# Patient Record
Sex: Male | Born: 1970
Health system: Southern US, Community
[De-identification: ages and names within clinical notes are randomized; demographics above are authoritative.]

## PROBLEM LIST (undated history)

## (undated) DIAGNOSIS — M12569 Traumatic arthropathy, unspecified knee: Secondary | ICD-10-CM

## (undated) DIAGNOSIS — G894 Chronic pain syndrome: Secondary | ICD-10-CM

## (undated) DIAGNOSIS — S92309A Fracture of unspecified metatarsal bone(s), unspecified foot, initial encounter for closed fracture: Secondary | ICD-10-CM

## (undated) DIAGNOSIS — F32A Depression, unspecified: Secondary | ICD-10-CM

## (undated) DIAGNOSIS — F419 Anxiety disorder, unspecified: Secondary | ICD-10-CM

## (undated) DIAGNOSIS — F329 Major depressive disorder, single episode, unspecified: Secondary | ICD-10-CM

## (undated) DIAGNOSIS — S32409A Unspecified fracture of unspecified acetabulum, initial encounter for closed fracture: Secondary | ICD-10-CM

## (undated) DIAGNOSIS — S82109A Unspecified fracture of upper end of unspecified tibia, initial encounter for closed fracture: Secondary | ICD-10-CM

## (undated) HISTORY — DX: Major depressive disorder, single episode, unspecified: F32.9

## (undated) HISTORY — DX: Unspecified fracture of unspecified acetabulum, initial encounter for closed fracture: S32.409A

## (undated) HISTORY — DX: Unspecified fracture of upper end of unspecified tibia, initial encounter for closed fracture: S82.109A

## (undated) HISTORY — DX: Traumatic arthropathy, unspecified knee: M12.569

## (undated) HISTORY — DX: Anxiety disorder, unspecified: F41.9

## (undated) HISTORY — DX: Fracture of unspecified metatarsal bone(s), unspecified foot, initial encounter for closed fracture: S92.309A

## (undated) HISTORY — DX: Depression, unspecified: F32.A

## (undated) HISTORY — DX: Chronic pain syndrome: G89.4

---

## 2001-08-30 ENCOUNTER — Encounter: Admission: RE | Admit: 2001-08-30 | Discharge: 2001-08-30 | Payer: Self-pay | Admitting: Oncology

## 2001-08-30 ENCOUNTER — Encounter (HOSPITAL_COMMUNITY): Admission: RE | Admit: 2001-08-30 | Discharge: 2001-09-29 | Payer: Self-pay | Admitting: Oncology

## 2001-10-03 ENCOUNTER — Encounter: Admission: RE | Admit: 2001-10-03 | Discharge: 2001-10-03 | Payer: Self-pay | Admitting: Oncology

## 2001-10-03 ENCOUNTER — Encounter (HOSPITAL_COMMUNITY): Admission: RE | Admit: 2001-10-03 | Discharge: 2001-11-02 | Payer: Self-pay | Admitting: Oncology

## 2001-10-22 ENCOUNTER — Encounter: Payer: Self-pay | Admitting: Internal Medicine

## 2001-10-23 ENCOUNTER — Inpatient Hospital Stay (HOSPITAL_COMMUNITY): Admission: EM | Admit: 2001-10-23 | Discharge: 2001-10-24 | Payer: Self-pay | Admitting: Internal Medicine

## 2001-11-06 ENCOUNTER — Encounter (HOSPITAL_COMMUNITY): Admission: RE | Admit: 2001-11-06 | Discharge: 2001-12-06 | Payer: Self-pay | Admitting: Oncology

## 2001-11-06 ENCOUNTER — Encounter: Admission: RE | Admit: 2001-11-06 | Discharge: 2001-11-06 | Payer: Self-pay | Admitting: Oncology

## 2001-11-14 ENCOUNTER — Ambulatory Visit (HOSPITAL_COMMUNITY): Admission: RE | Admit: 2001-11-14 | Discharge: 2001-11-14 | Payer: Self-pay | Admitting: Internal Medicine

## 2001-11-14 ENCOUNTER — Encounter: Payer: Self-pay | Admitting: Internal Medicine

## 2002-04-04 ENCOUNTER — Encounter (HOSPITAL_COMMUNITY): Admission: RE | Admit: 2002-04-04 | Discharge: 2002-05-04 | Payer: Self-pay | Admitting: Oncology

## 2002-11-02 ENCOUNTER — Encounter (HOSPITAL_COMMUNITY): Admission: RE | Admit: 2002-11-02 | Discharge: 2002-12-02 | Payer: Self-pay | Admitting: Oncology

## 2002-11-02 ENCOUNTER — Encounter: Admission: RE | Admit: 2002-11-02 | Discharge: 2002-11-02 | Payer: Self-pay | Admitting: Oncology

## 2003-02-07 ENCOUNTER — Encounter (HOSPITAL_COMMUNITY): Admission: RE | Admit: 2003-02-07 | Discharge: 2003-03-09 | Payer: Self-pay | Admitting: Unknown Physician Specialty

## 2003-02-07 ENCOUNTER — Encounter: Payer: Self-pay | Admitting: Unknown Physician Specialty

## 2003-11-08 ENCOUNTER — Emergency Department (HOSPITAL_COMMUNITY): Admission: EM | Admit: 2003-11-08 | Discharge: 2003-11-08 | Payer: Self-pay | Admitting: Emergency Medicine

## 2005-01-07 ENCOUNTER — Emergency Department (HOSPITAL_COMMUNITY): Admission: EM | Admit: 2005-01-07 | Discharge: 2005-01-07 | Payer: Self-pay | Admitting: *Deleted

## 2005-01-09 ENCOUNTER — Ambulatory Visit: Payer: Self-pay | Admitting: Physical Medicine & Rehabilitation

## 2005-01-09 ENCOUNTER — Inpatient Hospital Stay (HOSPITAL_COMMUNITY): Admission: AC | Admit: 2005-01-09 | Discharge: 2005-01-27 | Payer: Self-pay

## 2005-01-27 ENCOUNTER — Inpatient Hospital Stay (HOSPITAL_COMMUNITY)
Admission: RE | Admit: 2005-01-27 | Discharge: 2005-03-02 | Payer: Self-pay | Admitting: Physical Medicine & Rehabilitation

## 2005-01-27 ENCOUNTER — Ambulatory Visit: Payer: Self-pay | Admitting: Physical Medicine & Rehabilitation

## 2005-04-29 ENCOUNTER — Encounter (HOSPITAL_COMMUNITY)
Admission: RE | Admit: 2005-04-29 | Discharge: 2005-05-29 | Payer: Self-pay | Admitting: Physical Medicine & Rehabilitation

## 2005-06-07 ENCOUNTER — Encounter (HOSPITAL_COMMUNITY): Admission: RE | Admit: 2005-06-07 | Discharge: 2005-07-07 | Payer: Self-pay | Admitting: Internal Medicine

## 2005-07-09 ENCOUNTER — Encounter: Admission: RE | Admit: 2005-07-09 | Discharge: 2005-08-08 | Payer: Self-pay | Admitting: Internal Medicine

## 2005-08-11 ENCOUNTER — Encounter (HOSPITAL_COMMUNITY): Admission: RE | Admit: 2005-08-11 | Discharge: 2005-09-10 | Payer: Self-pay | Admitting: Internal Medicine

## 2006-01-26 ENCOUNTER — Ambulatory Visit: Payer: Self-pay | Admitting: Psychology
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
## 2006-10-04 ENCOUNTER — Encounter
Admission: RE | Admit: 2006-10-04 | Discharge: 2007-01-02 | Payer: Self-pay | Admitting: Physical Medicine & Rehabilitation

## 2006-10-07 ENCOUNTER — Ambulatory Visit: Payer: Self-pay | Admitting: Physical Medicine & Rehabilitation

## 2006-10-27 ENCOUNTER — Ambulatory Visit (HOSPITAL_COMMUNITY)
Admission: RE | Admit: 2006-10-27 | Discharge: 2006-10-27 | Payer: Self-pay | Admitting: Physical Medicine & Rehabilitation

## 2006-10-31 ENCOUNTER — Encounter
Admission: RE | Admit: 2006-10-31 | Discharge: 2007-01-29 | Payer: Self-pay | Admitting: Physical Medicine & Rehabilitation

## 2006-11-25 ENCOUNTER — Ambulatory Visit: Payer: Self-pay | Admitting: Physical Medicine & Rehabilitation

## 2007-01-23 ENCOUNTER — Encounter
Admission: RE | Admit: 2007-01-23 | Discharge: 2007-04-23 | Payer: Self-pay | Admitting: Physical Medicine & Rehabilitation

## 2007-01-23 ENCOUNTER — Ambulatory Visit: Payer: Self-pay | Admitting: Physical Medicine & Rehabilitation

## 2007-03-20 ENCOUNTER — Ambulatory Visit: Payer: Self-pay | Admitting: Physical Medicine & Rehabilitation

## 2007-05-10 ENCOUNTER — Encounter
Admission: RE | Admit: 2007-05-10 | Discharge: 2007-08-08 | Payer: Self-pay | Admitting: Physical Medicine & Rehabilitation

## 2007-05-10 ENCOUNTER — Ambulatory Visit: Payer: Self-pay | Admitting: Physical Medicine & Rehabilitation

## 2007-06-30 ENCOUNTER — Ambulatory Visit: Payer: Self-pay | Admitting: Physical Medicine & Rehabilitation

## 2007-09-21 ENCOUNTER — Encounter
Admission: RE | Admit: 2007-09-21 | Discharge: 2007-11-16 | Payer: Self-pay | Admitting: Physical Medicine & Rehabilitation

## 2007-09-21 ENCOUNTER — Ambulatory Visit: Payer: Self-pay | Admitting: Physical Medicine & Rehabilitation

## 2007-11-15 ENCOUNTER — Ambulatory Visit: Payer: Self-pay | Admitting: Physical Medicine & Rehabilitation

## 2007-12-12 ENCOUNTER — Encounter
Admission: RE | Admit: 2007-12-12 | Discharge: 2008-03-11 | Payer: Self-pay | Admitting: Physical Medicine & Rehabilitation

## 2008-01-03 ENCOUNTER — Ambulatory Visit: Payer: Self-pay | Admitting: Physical Medicine & Rehabilitation

## 2008-03-04 ENCOUNTER — Encounter
Admission: RE | Admit: 2008-03-04 | Discharge: 2008-06-02 | Payer: Self-pay | Admitting: Physical Medicine & Rehabilitation

## 2008-03-04 ENCOUNTER — Ambulatory Visit: Payer: Self-pay | Admitting: Physical Medicine & Rehabilitation

## 2008-03-20 ENCOUNTER — Ambulatory Visit: Payer: Self-pay | Admitting: Physical Medicine & Rehabilitation

## 2008-04-19 ENCOUNTER — Ambulatory Visit: Payer: Self-pay | Admitting: Physical Medicine & Rehabilitation

## 2008-05-16 ENCOUNTER — Ambulatory Visit: Payer: Self-pay | Admitting: Physical Medicine & Rehabilitation

## 2008-06-06 ENCOUNTER — Encounter
Admission: RE | Admit: 2008-06-06 | Discharge: 2008-09-04 | Payer: Self-pay | Admitting: Physical Medicine & Rehabilitation

## 2008-06-10 ENCOUNTER — Ambulatory Visit: Payer: Self-pay | Admitting: Physical Medicine & Rehabilitation

## 2008-07-12 ENCOUNTER — Ambulatory Visit: Payer: Self-pay | Admitting: Physical Medicine & Rehabilitation

## 2008-08-09 ENCOUNTER — Ambulatory Visit: Payer: Self-pay | Admitting: Physical Medicine & Rehabilitation

## 2008-09-04 ENCOUNTER — Encounter
Admission: RE | Admit: 2008-09-04 | Discharge: 2008-12-03 | Payer: Self-pay | Admitting: Physical Medicine & Rehabilitation

## 2008-09-06 ENCOUNTER — Ambulatory Visit: Payer: Self-pay | Admitting: Physical Medicine & Rehabilitation

## 2008-10-02 ENCOUNTER — Ambulatory Visit: Payer: Self-pay | Admitting: Physical Medicine & Rehabilitation

## 2008-10-29 ENCOUNTER — Ambulatory Visit: Payer: Self-pay | Admitting: Physical Medicine & Rehabilitation

## 2008-11-27 ENCOUNTER — Ambulatory Visit: Payer: Self-pay | Admitting: Physical Medicine & Rehabilitation

## 2008-12-30 ENCOUNTER — Encounter
Admission: RE | Admit: 2008-12-30 | Discharge: 2009-03-30 | Payer: Self-pay | Admitting: Physical Medicine & Rehabilitation

## 2008-12-31 ENCOUNTER — Ambulatory Visit: Payer: Self-pay | Admitting: Physical Medicine & Rehabilitation

## 2009-01-22 ENCOUNTER — Ambulatory Visit: Payer: Self-pay | Admitting: Physical Medicine & Rehabilitation

## 2009-02-21 ENCOUNTER — Ambulatory Visit: Payer: Self-pay | Admitting: Physical Medicine & Rehabilitation

## 2009-03-13 ENCOUNTER — Encounter
Admission: RE | Admit: 2009-03-13 | Discharge: 2009-06-11 | Payer: Self-pay | Admitting: Physical Medicine & Rehabilitation

## 2009-03-19 ENCOUNTER — Ambulatory Visit: Payer: Self-pay | Admitting: Physical Medicine & Rehabilitation

## 2009-04-16 ENCOUNTER — Ambulatory Visit: Payer: Self-pay | Admitting: Physical Medicine & Rehabilitation

## 2009-05-16 ENCOUNTER — Ambulatory Visit: Payer: Self-pay | Admitting: Physical Medicine & Rehabilitation

## 2009-06-11 ENCOUNTER — Ambulatory Visit: Payer: Self-pay | Admitting: Physical Medicine & Rehabilitation

## 2009-07-08 ENCOUNTER — Encounter
Admission: RE | Admit: 2009-07-08 | Discharge: 2009-09-22 | Payer: Self-pay | Admitting: Physical Medicine & Rehabilitation

## 2009-07-10 ENCOUNTER — Ambulatory Visit: Payer: Self-pay | Admitting: Physical Medicine & Rehabilitation

## 2009-08-08 ENCOUNTER — Ambulatory Visit: Payer: Self-pay | Admitting: Physical Medicine & Rehabilitation

## 2009-08-29 ENCOUNTER — Ambulatory Visit: Payer: Self-pay | Admitting: Physical Medicine & Rehabilitation

## 2009-09-22 ENCOUNTER — Encounter
Admission: RE | Admit: 2009-09-22 | Discharge: 2009-12-04 | Payer: Self-pay | Admitting: Physical Medicine & Rehabilitation

## 2009-09-26 ENCOUNTER — Ambulatory Visit: Payer: Self-pay | Admitting: Physical Medicine & Rehabilitation

## 2009-10-29 ENCOUNTER — Ambulatory Visit: Payer: Self-pay | Admitting: Physical Medicine & Rehabilitation

## 2009-12-01 ENCOUNTER — Ambulatory Visit: Payer: Self-pay | Admitting: Physical Medicine & Rehabilitation

## 2009-12-16 ENCOUNTER — Encounter (HOSPITAL_COMMUNITY)
Admission: RE | Admit: 2009-12-16 | Discharge: 2010-01-15 | Payer: Self-pay | Admitting: Physical Medicine & Rehabilitation

## 2009-12-25 ENCOUNTER — Encounter
Admission: RE | Admit: 2009-12-25 | Discharge: 2010-03-25 | Payer: Self-pay | Admitting: Physical Medicine & Rehabilitation

## 2009-12-31 ENCOUNTER — Ambulatory Visit: Payer: Self-pay | Admitting: Physical Medicine & Rehabilitation

## 2010-01-22 ENCOUNTER — Encounter (HOSPITAL_COMMUNITY)
Admission: RE | Admit: 2010-01-22 | Discharge: 2010-02-19 | Payer: Self-pay | Admitting: Physical Medicine & Rehabilitation

## 2010-01-29 ENCOUNTER — Ambulatory Visit: Payer: Self-pay | Admitting: Physical Medicine & Rehabilitation

## 2010-02-23 ENCOUNTER — Ambulatory Visit: Payer: Self-pay | Admitting: Physical Medicine & Rehabilitation

## 2010-03-19 ENCOUNTER — Ambulatory Visit: Payer: Self-pay | Admitting: Physical Medicine & Rehabilitation

## 2010-04-21 ENCOUNTER — Encounter
Admission: RE | Admit: 2010-04-21 | Discharge: 2010-04-23 | Payer: Self-pay | Admitting: Physical Medicine & Rehabilitation

## 2010-04-23 ENCOUNTER — Ambulatory Visit: Payer: Self-pay | Admitting: Physical Medicine & Rehabilitation

## 2010-06-17 ENCOUNTER — Ambulatory Visit: Payer: Self-pay | Admitting: Physical Medicine & Rehabilitation

## 2010-06-17 ENCOUNTER — Encounter
Admission: RE | Admit: 2010-06-17 | Discharge: 2010-09-07 | Payer: Self-pay | Admitting: Physical Medicine & Rehabilitation

## 2010-07-16 ENCOUNTER — Ambulatory Visit: Payer: Self-pay | Admitting: Physical Medicine & Rehabilitation

## 2010-08-13 ENCOUNTER — Ambulatory Visit: Payer: Self-pay | Admitting: Physical Medicine & Rehabilitation

## 2010-09-07 ENCOUNTER — Encounter
Admission: RE | Admit: 2010-09-07 | Discharge: 2010-11-25 | Payer: Self-pay | Source: Home / Self Care | Attending: Physical Medicine & Rehabilitation | Admitting: Physical Medicine & Rehabilitation

## 2010-09-14 ENCOUNTER — Ambulatory Visit: Payer: Self-pay | Admitting: Physical Medicine & Rehabilitation

## 2010-10-08 ENCOUNTER — Ambulatory Visit: Payer: Self-pay | Admitting: Physical Medicine & Rehabilitation

## 2010-11-03 ENCOUNTER — Ambulatory Visit: Payer: Self-pay | Admitting: Physical Medicine & Rehabilitation

## 2010-11-25 ENCOUNTER — Ambulatory Visit: Payer: Self-pay | Admitting: Physical Medicine & Rehabilitation

## 2010-12-30 ENCOUNTER — Encounter
Admission: RE | Admit: 2010-12-30 | Discharge: 2010-12-31 | Payer: Self-pay | Source: Home / Self Care | Attending: Physical Medicine & Rehabilitation | Admitting: Physical Medicine & Rehabilitation

## 2010-12-31 ENCOUNTER — Ambulatory Visit
Admission: RE | Admit: 2010-12-31 | Discharge: 2010-12-31 | Payer: Self-pay | Source: Home / Self Care | Attending: Physical Medicine & Rehabilitation | Admitting: Physical Medicine & Rehabilitation

## 2011-01-03 ENCOUNTER — Encounter: Payer: Self-pay | Admitting: Urology

## 2011-01-28 ENCOUNTER — Ambulatory Visit (HOSPITAL_BASED_OUTPATIENT_CLINIC_OR_DEPARTMENT_OTHER): Payer: Medicaid Other

## 2011-01-28 ENCOUNTER — Encounter: Payer: Medicaid Other | Attending: Physical Medicine & Rehabilitation

## 2011-01-28 DIAGNOSIS — IMO0002 Reserved for concepts with insufficient information to code with codable children: Secondary | ICD-10-CM

## 2011-01-28 DIAGNOSIS — S32409A Unspecified fracture of unspecified acetabulum, initial encounter for closed fracture: Secondary | ICD-10-CM

## 2011-01-28 DIAGNOSIS — S92309A Fracture of unspecified metatarsal bone(s), unspecified foot, initial encounter for closed fracture: Secondary | ICD-10-CM

## 2011-01-28 DIAGNOSIS — R262 Difficulty in walking, not elsewhere classified: Secondary | ICD-10-CM | POA: Insufficient documentation

## 2011-01-28 DIAGNOSIS — M12569 Traumatic arthropathy, unspecified knee: Secondary | ICD-10-CM | POA: Insufficient documentation

## 2011-01-28 DIAGNOSIS — F341 Dysthymic disorder: Secondary | ICD-10-CM | POA: Insufficient documentation

## 2011-01-28 DIAGNOSIS — G8929 Other chronic pain: Secondary | ICD-10-CM | POA: Insufficient documentation

## 2011-03-24 ENCOUNTER — Encounter: Payer: Medicaid Other | Attending: Physical Medicine & Rehabilitation | Admitting: Physical Medicine & Rehabilitation

## 2011-03-24 ENCOUNTER — Encounter: Payer: Medicaid Other | Admitting: Physical Medicine & Rehabilitation

## 2011-03-24 DIAGNOSIS — S92309A Fracture of unspecified metatarsal bone(s), unspecified foot, initial encounter for closed fracture: Secondary | ICD-10-CM

## 2011-03-24 DIAGNOSIS — R110 Nausea: Secondary | ICD-10-CM | POA: Insufficient documentation

## 2011-03-24 DIAGNOSIS — G894 Chronic pain syndrome: Secondary | ICD-10-CM | POA: Insufficient documentation

## 2011-03-24 DIAGNOSIS — R209 Unspecified disturbances of skin sensation: Secondary | ICD-10-CM | POA: Insufficient documentation

## 2011-03-24 DIAGNOSIS — S82109A Unspecified fracture of upper end of unspecified tibia, initial encounter for closed fracture: Secondary | ICD-10-CM

## 2011-03-24 DIAGNOSIS — S32409A Unspecified fracture of unspecified acetabulum, initial encounter for closed fracture: Secondary | ICD-10-CM

## 2011-03-24 DIAGNOSIS — F341 Dysthymic disorder: Secondary | ICD-10-CM | POA: Insufficient documentation

## 2011-03-24 DIAGNOSIS — M1259 Traumatic arthropathy, multiple sites: Secondary | ICD-10-CM | POA: Insufficient documentation
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
## 2011-04-22 ENCOUNTER — Encounter: Payer: Medicaid Other | Attending: Physical Medicine & Rehabilitation

## 2011-04-22 ENCOUNTER — Ambulatory Visit: Payer: Medicaid Other

## 2011-04-22 DIAGNOSIS — M12579 Traumatic arthropathy, unspecified ankle and foot: Secondary | ICD-10-CM | POA: Insufficient documentation

## 2011-04-22 DIAGNOSIS — G890 Central pain syndrome: Secondary | ICD-10-CM | POA: Insufficient documentation

## 2011-04-22 DIAGNOSIS — S82109A Unspecified fracture of upper end of unspecified tibia, initial encounter for closed fracture: Secondary | ICD-10-CM

## 2011-04-22 DIAGNOSIS — S32409A Unspecified fracture of unspecified acetabulum, initial encounter for closed fracture: Secondary | ICD-10-CM

## 2011-04-22 DIAGNOSIS — F411 Generalized anxiety disorder: Secondary | ICD-10-CM | POA: Insufficient documentation

## 2011-04-22 DIAGNOSIS — M25569 Pain in unspecified knee: Secondary | ICD-10-CM | POA: Insufficient documentation

## 2011-04-22 DIAGNOSIS — M12569 Traumatic arthropathy, unspecified knee: Secondary | ICD-10-CM | POA: Insufficient documentation

## 2011-04-22 DIAGNOSIS — S92309A Fracture of unspecified metatarsal bone(s), unspecified foot, initial encounter for closed fracture: Secondary | ICD-10-CM
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
## 2011-04-27 NOTE — Assessment & Plan Note (Signed)
FOLLOWUP OFFICE NOTE   Mike Coleman is back regarding his pain.  He did not do well transitioning from  Duragesic to Kadian.  I increased his Kadian this week to t.i.d. dosing,  and this did not make much of a difference.  He is having 9/10 pain.  The pain is stabbing and constant.  The pain interferes with general  activity, relations with others, and enjoyment of life on a severe  level.  Sleep is poor.  Pain increases with walking, bending, sitting,  standing, and some activities.  He states he can walk about 20 minutes  at a time without having to stop.   REVIEW OF SYSTEMS:  Notable for numbness, tingling, trouble walking.  Other pertinent positives as listed above and full review is in the  health and history section of the chart.  The patient's urine drug  screen was negative at last visit.   PHYSICAL EXAM:  Blood pressure 132/77, pulse 91, respiratory rate 16,  satting 98% on room air.  The patient is pleasant, a bit disjointed and inconsistent with his  thought process, but is much improved from last visit.  He appears more  alert and appropriate.  HEART:  Regular.  CHEST:  Clear.  ABDOMEN:  Soft and nontender.  Remains tender in his usual spots in the right foot and left knee.   ASSESSMENT:  History of polytrauma with left tibial plateau and right  metatarsal fractures.   PLAN:  1. Will change back to Duragesic 200 mcg q.72h #20 100 mcg patches.  2. Continue oxycodone for breakthrough pain 15 mg 1 q.4-6h p.r.n.      #150.  3. Will titrate up Lyrica to 75 mg t.i.d. at least.  4. Continue Celebrex.  5. Continue Tofranil 75 mg nightly  6. I will see the patient back in about 1 to 2 months' time.      Ranelle Oyster, M.D.  Electronically Signed     ZTS/MedQ  D:  06/02/2007 13:10:39  T:  06/02/2007 16:36:09  Job #:  [id number]   cc:   Kingsley Callander. Ouida Sills, MD  Fax: [phone]

## 2011-04-27 NOTE — Assessment & Plan Note (Signed)
Mike Coleman is back regarding his pain.  He has been doing a bit better with  the Duragesic patches again.  He is having more left knee pain, however.  The left knee pain seems to be surpassing some of his foot pain now.  He  has not been able to get in touch with the surgeon who had performed the  surgery initially on the left knee.  He states that he is afraid to walk  out of the house, particularly in the grass and uneven surfaces due to  the fact that his knee feels like it is giving way.   The patient rates his pain as an 8 out of 10.  He states that it  interferes with general activity, relations with others, and enjoyment  of life on a severe level.   The patient remains on:  1. Duragesic 200 mcg every 72 hours.  2. Oxycodone 15 mg one q.4-6 h. p.r.n.  3. Lyrica has titrated up to 75 mg t.i.d. after last visit.   REVIEW OF SYSTEMS:  Notable for the above.  A full review is in the  written health and history section.   SOCIAL HISTORY:  Without significant change.   PHYSICAL EXAMINATION:  VITAL SIGNS:  Blood pressure 135/61, pulse is  105, respiratory rate 20, sating 96% on room air.  GENERAL:  The patient is generally pleasant.  A bit more alert today and  less anxious.  HEART:  Regular rhythm with increased rate.  CHEST:  Clear.  ABDOMEN:  Soft, nontender.  EXTREMITIES:  The left knee is notable for palpable hardware at the left  tibia and positive anterior drawer sign today with pain in the medial  compartment as well with varus stress.  Questionable posterior drawer  sign.  Right foot is stable with pain and some deformity noted.   ASSESSMENT:  History of poly trauma including left tibial plateau and  right metatarsal fractures.   PLAN:  1. Continue Duragesic patch 200 mcg every 72 hours.  2. Oxycodone 15 mg one q.4-6 h. p.r.n.  3. Continue Lyrica, Celebrex, Tofranil.  4. The patient needs an appointment with orthopedic surgery to      evaluate the left knee.  It is  probably a bit early for a knee      replacement for him but perhaps he could have hardware removed or      further reconstructive surgery performed.  I will leave this up to      surgery.  He should ask Dr. Lestine Box when he sees him back in      followup if      he may see one of his partners for evaluation of the left knee.  5. I will see him back in 3 months with a nurse clinic followup in 1      month.      Ranelle Oyster, M.D.  Electronically Signed     ZTS/MedQ  D:  07/03/2007 12:54:00  T:  07/03/2007 14:15:52  Job #:  [id number]   cc:   Kingsley Callander. Ouida Sills, MD  Fax: [phone]

## 2011-04-27 NOTE — Assessment & Plan Note (Signed)
Mike Coleman is back regarding his pain.  He asked about taking his Duragesic  more often at q.48h intervals.  His pain has been a little bit worse in  the left leg after falling on his left knee in the yard.  It is easing  up a bit, but still more of a problem.  He still complains of right hip  pain as well.  Pain rates a 9/10 today.  Pain interferes with his  general activity, relations with others, and enjoyment of life on a  severe level.  Sleep is fair.   REVIEW OF SYSTEMS:  Notable for numbness, still has a catching in his  left knee and hip.   Left knee is notable for an abrasion where he fell, but seems to be  intact and generally stable.  Strength is generally intact with some  pain inhibition still at the right ankle and foot.  Abdomen is soft and nontender.  Chest is clear.  Heart is regular rate and rhythm.   ASSESSMENT:  History of polytrauma with left tibial plateau and right  metatarsal fractures.   PLAN:  1. Will increase to q48 hour Fentanyl but decrease overall dosing to      150 mcg.  2. Oxycodone 15 mg 1 q.4h to q.6h p.r.n.  3. Continue Lyrica, Celebrex, Tofranil, and Celexa.  4. I urged him follow up with orthopedic surgery.  Dr. Fonnie Jarvis stated      that he would perform surgery if he quit smoking, but he has yet to      do that.  I think there are some other issues still involved here      with Mike Coleman related to his decadence upon the pain medication.  We      will watch him closely in the months going forward.      Ranelle Oyster, M.D.  Electronically Signed     ZTS/MedQ  D:  12/15/2007 13:18:18  T:  12/15/2007 14:11:39  Job #:  [id number]   cc:   Kingsley Callander. Ouida Sills, MD  Fax: [phone]
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
## 2011-04-30 NOTE — Assessment & Plan Note (Signed)
Mike Coleman is back regarding his chronic back and leg pain.  Really not much  as change since I last saw him.  His pain is 8-9/10.  Pain is sharp,  stabbing, and aching.  Pain interferes with general activity, relations  with others, enjoyment life on a severe level.  Sleep is poor.   REVIEW OF SYSTEMS:  Notable for numbness, tingling, weakness, trouble  walking, confusion, depression, anxiety, diarrhea, nausea, and poor  appetite.  Other pertinent positives are above and full review is in the  written health and history section of the chart.   SOCIAL HISTORY:  The patient is divorced and lives with his son,  although his parents care for him during the week.   PHYSICAL EXAMINATION:  VITAL SIGNS:  Blood pressure is 130/65, pulse is  105, respiratory rate is 16.  He is sating 93% on room air.  GENERAL:  The patient is generally pleasant.  He is disheveled in  appearance.  EXTREMITIES:  He walks with his cane, supporting his left leg more than  the right.  NEUROLOGIC:  His cognition is normal.  HEART:  Regular rhythm, tachycardiac.  CHEST:  Clear.  ABDOMEN:  Soft, nontender.   ASSESSMENT:  1. History of polytrauma with left tibial plateau, fracture of right      metatarsal fracture with post-traumatic arthritis.  2. Chronic pain/central sensitization.  3. Chronic low back pain.  4. Depression with anxiety.   PLAN:  1. Again, the patient remained centered on his pain and it is primary      focus of his life.  He has no social, vocational, or family      activities except for limited times on the weekend that he spends      with his son.  I expressed at length the need for the patient to      pursue other avenues to consume his life.  We have spoke in the      past about lumbar sympathetic ganglion blocks and they also spoke      about spinal stimulator, although I am not overwhelmingly      optimistic that these would be beneficial on a long run considering      his chronic picture.  2. I will see him back in 3 months with 23-month followup.  He was with      his fentanyl and oxycodone refilled today.      Ranelle Oyster, M.D.  Electronically Signed     ZTS/MedQ  D:  08/29/2009 10:05:52  T:  08/30/2009 00:27:39  Job #:  [id number]   cc:   Kingsley Callander. Ouida Sills, MD  Fax: [phone]

## 2011-05-19 ENCOUNTER — Ambulatory Visit: Payer: Medicaid Other | Admitting: Physical Medicine & Rehabilitation

## 2011-05-19 ENCOUNTER — Ambulatory Visit: Payer: Medicaid Other

## 2011-05-26 ENCOUNTER — Encounter: Payer: Medicaid Other | Attending: Physical Medicine & Rehabilitation | Admitting: Physical Medicine & Rehabilitation

## 2011-05-26 DIAGNOSIS — F341 Dysthymic disorder: Secondary | ICD-10-CM

## 2011-05-26 DIAGNOSIS — S32409A Unspecified fracture of unspecified acetabulum, initial encounter for closed fracture: Secondary | ICD-10-CM

## 2011-05-26 DIAGNOSIS — S82109A Unspecified fracture of upper end of unspecified tibia, initial encounter for closed fracture: Secondary | ICD-10-CM

## 2011-05-26 DIAGNOSIS — S92309A Fracture of unspecified metatarsal bone(s), unspecified foot, initial encounter for closed fracture: Secondary | ICD-10-CM

## 2011-05-26 DIAGNOSIS — G894 Chronic pain syndrome: Secondary | ICD-10-CM | POA: Insufficient documentation

## 2011-05-26 DIAGNOSIS — M1259 Traumatic arthropathy, multiple sites: Secondary | ICD-10-CM | POA: Insufficient documentation

## 2011-05-27 NOTE — Assessment & Plan Note (Signed)
Mike Coleman is back regarding his multiple pain complaints.  He continues to have issues with anxiety.  He points to his Valium being the source of all his problems including pain, mood, sleep, etc.  Pain is 6-8/10.  I showed him his pain levels over the last 2 years which are not to dissimilar from that.  Pain is burning, stabbing, aching, tingling involving the back as well as right hip, left foot and left knee.  REVIEW OF SYSTEMS:  Notable for numbness, anxiety, constipation, abdominal pain and limb swelling.  SOCIAL HISTORY:  The patient lives with his son and involved with his parents, although he has problems dealing with his parents who feels resent him.  He feels limited due to transportation as well.  Pain also is a factor.  PHYSICAL EXAMINATION:  VITAL SIGNS:  Blood pressure 120/64, pulse 76, respiratory rate 18, he is satting 96% on room air. GENERAL:  He is generally pleasant, alert.  Insight is I feel limited today.  He is generally appropriate otherwise. EXTREMITIES:  He does walk with antalgia favoring the left leg more than right, but both are tender.  He has chronic flat affect. HEART:  Regular. CHEST:  Clear. ABDOMEN:  Soft, nontender.  ASSESSMENT: 1. History of left tibial plateau fracture, right metatarsal fractures     with post-traumatic arthritis. 2. Chronic pain syndrome with central sensitization. 3. Anxiety with depression.  PLAN: 1. I agreed to write valium today as the patient will not let off     this.  However, I gave him the stipulations that he will need to     participate in outpatient counseling/Psychology program as well as     take his scheduled Zoloft 50 mg daily. 2. We stopped Klonopin which was also a stipulation for receiving the     valium. 3. Refilled oxycodone #90, fentanyl patch 100 mcg #30 as well as     Celebrex and Limbrel. 4. We discussed with the patient that there is not an answer in a     bottle here for him.  He has learned some  coping strategies and     skills and learned to help himself a bit which I feel has lost     along the way here over the last several years.  Again, I feel the     patient's insight overall is limited in this area. 5. I will see the nurse practitioner back in a month.  ADDENDUM:  It was brought to my attention after this visit that Mike Coleman was receiving xanax and klonopin from another provider (which he did not disclose).  My RN was asked to call him and give Mike Coleman the instructions to return to our office to surrender these meds.  At that time, we'll release the prescription for the valium at the pharmacy.  If he fails to surrender these meds to [place], no further prescriptions of any kind will be provided to [place], and he will be discharged from this office.  Any noncompliance with the other listed stipulations will mean discharge from this office.  A UDS will be collected at the time of the next visit here pending his compliance with the above stipulations.    Ranelle Oyster, M.D. Electronically Signed   ZTS/MedQ D:  05/26/2011 14:53:23  T:  05/27/2011 01:14:56  Job #:  [id number]

## 2011-06-15 ENCOUNTER — Encounter: Payer: Medicaid Other | Attending: Neurosurgery | Admitting: Neurosurgery

## 2011-06-15 DIAGNOSIS — M549 Dorsalgia, unspecified: Secondary | ICD-10-CM | POA: Insufficient documentation

## 2011-06-15 DIAGNOSIS — M12569 Traumatic arthropathy, unspecified knee: Secondary | ICD-10-CM | POA: Insufficient documentation

## 2011-06-15 DIAGNOSIS — F329 Major depressive disorder, single episode, unspecified: Secondary | ICD-10-CM

## 2011-06-15 DIAGNOSIS — G894 Chronic pain syndrome: Secondary | ICD-10-CM

## 2011-06-15 DIAGNOSIS — G8929 Other chronic pain: Secondary | ICD-10-CM | POA: Insufficient documentation

## 2011-06-15 DIAGNOSIS — F341 Dysthymic disorder: Secondary | ICD-10-CM | POA: Insufficient documentation

## 2011-07-10 NOTE — Assessment & Plan Note (Signed)
This is a patient of Dr. Rosalyn Charters who was seen for multiple pain complaints.  He has problems with anxiety as well as back pain, mood and sleep issues.  He saw Dr. Riley Kill last month and will follow up with him next Monday.  The patient reports no pain changes.  He rates his pain 5- 7, it is a stabbing, tingling-type pain.  His general activity is at the level of 7-9.  Pain is the same 24 hours a day.  Sleep patterns are poor.  Walking, bending, sitting tend to aggravate; rest, therapy, and medication tend to help.  He walks without assistance.  He can walk up to 30 minutes at a time.  He is not employed.  REVIEW OF SYSTEMS:  Notable for those difficulties as well as some paresthesias, depression, anxiety.  No suicidal thoughts or aberrant behaviors.  PAST MEDICAL HISTORY:  Unchanged.  SOCIAL HISTORY:  He is divorced, lives with his son.  FAMILY HISTORY:  Unchanged.  PHYSICAL EXAMINATION:  VITAL SIGNS:  His blood pressure is 129/73, pulse 86, respirations 20, O2 sats 95 on room air. CONSTITUTIONAL:  He is within normal limits. NEUROLOGIC:  He is alert and oriented.  His strength is good.  His sensations are intact.  ASSESSMENT: 1. History of left tibia-fibula fracture with arthritis resulting. 2. Chronic pain. 3. Anxiety and depression.  PLAN: 1. He will follow up with Dr. Riley Kill in 1 month. 2. Refill Duragesic transdermal 100 mcg 2 transdermal every 48 hours,     30 with no refill. 3. Oxycodone 50 mg 1-2 p.o. q.6 h. p.r.n. 90 with no refill.  He is in     agreement with the plan.  His questions were encouraged and     answered.     Naliyah Neth L. Blima Dessert Electronically Signed    RLW/MedQ D:  07/09/2011 15:06:27  T:  07/10/2011 00:54:41  Job #:  [id number]

## 2011-07-14 ENCOUNTER — Encounter: Payer: Medicaid Other | Attending: Physical Medicine & Rehabilitation | Admitting: Physical Medicine & Rehabilitation

## 2011-07-14 DIAGNOSIS — F341 Dysthymic disorder: Secondary | ICD-10-CM | POA: Insufficient documentation

## 2011-07-14 DIAGNOSIS — S82109A Unspecified fracture of upper end of unspecified tibia, initial encounter for closed fracture: Secondary | ICD-10-CM

## 2011-07-14 DIAGNOSIS — S32409A Unspecified fracture of unspecified acetabulum, initial encounter for closed fracture: Secondary | ICD-10-CM

## 2011-07-14 DIAGNOSIS — S92309A Fracture of unspecified metatarsal bone(s), unspecified foot, initial encounter for closed fracture: Secondary | ICD-10-CM

## 2011-07-14 DIAGNOSIS — R209 Unspecified disturbances of skin sensation: Secondary | ICD-10-CM | POA: Insufficient documentation

## 2011-07-14 DIAGNOSIS — M549 Dorsalgia, unspecified: Secondary | ICD-10-CM | POA: Insufficient documentation

## 2011-07-14 NOTE — Assessment & Plan Note (Signed)
Mike Coleman is back regarding his diffuse pain particularly in the back and lower extremities.  He is now set up to see psychologist in his area, deemed [name].  He is involved in a group program through [facility] which he does not feel is quite appropriate for him.  We had placed the patient on Klonopin.  There was confusion regarding his prior Valium, Xanax.  The patient was not being totally on the level with [place] regarding his medications he was taking all three.  I had warned him about taking meds that are not prescribed by our practice and the ramifications for him in this clinic.  He tells me that he ran short of his Valium and had a lot of anxiety about this appointment and took some remaining Klonopin which he is now out of.  He states that he needs more Valium to help control his mood.  He still remains quite reclusive, depressed, etc.  Pain is still 8-9/10 in the familiar areas including his back, knees, and feet.  Sleep is poor.  REVIEW OF SYSTEMS:  Notable for the above.  He does have some anxiety, diarrhea, constipation, depression.  Full 12-point review is in the written health and history section of the chart.  SOCIAL HISTORY:  The patient is divorced.  He lives with his son.  PHYSICAL EXAMINATION:  VITAL SIGNS:  Blood pressure is 130/83, pulse 80, respiratory rate 16, he is satting 94% on room air. GENERAL:  The patient is pleasant, cooperative.  He does get anxious at times.  He has sometimes problems finding words and thoughts, which is anxiety. MUSCULOSKELETAL:  Lower extremity exam is unchanged with tender range of motion in the knees and pain with palpation of the feet and weightbearing.  ASSESSMENT: 1. History of left tibia fibula fracture with posttraumatic arthritis     resulting chronic pain syndrome. 2. Anxiety and depression.  PLAN: 1. I am happy that he is beginning psychological followup.  I stated     that his participation in our pain program is  continued upon     psychological assistance as that is a key component here for him as     he lost all compensating abilities and he has become quite     depressed and anxious.  He needs to find himself a way with the     help of our clinic as well as a psychologist in getting out of the     "groove" that he is in.  We discussed this on multiple occasions.     However, this is not going to happen on its own and he will need to     be in active participating at any type of counseling program going     forward.  I will continue with the group therapy for now unless Dr.     [name] feels it is counterproductive for him. 2. I refilled his oxycodone 15, #90, his fentanyl patch 100 mcg #30     today. 3. I increased his Valium to 10 twice a day and added trazodone 1-2 at     nighttime for anxiety, depression, and sleep. 4. I made it clear in front of my nurse that any further drug test     which demonstrate medication is not prescribed through this office     will mean his abrupt discharge from this clinic.  We have given     Mike Coleman plenty of slack here     and I  could not continue to do so over the long term.  He is quite     clear with this and agrees. 5. We will see him back in a month.  We will check a urine drug screen     at that time.     Ranelle Oyster, M.D. Electronically Signed    ZTS/MedQ D:  07/14/2011 14:53:23  T:  07/14/2011 22:20:49  Job #:  [id number]  cc:   Audrie Gallus Fax [phone]

## 2011-08-12 ENCOUNTER — Encounter (HOSPITAL_BASED_OUTPATIENT_CLINIC_OR_DEPARTMENT_OTHER): Payer: Medicaid Other | Admitting: Neurosurgery

## 2011-08-12 DIAGNOSIS — F341 Dysthymic disorder: Secondary | ICD-10-CM

## 2011-08-12 DIAGNOSIS — G8928 Other chronic postprocedural pain: Secondary | ICD-10-CM

## 2011-08-13 NOTE — Assessment & Plan Note (Signed)
HISTORY:  Patient Mike Coleman, seen for diffuse back and lower extremity pain as well as neck pain.  The patient reports no change in his medicines, but he does say more anxiety levels since Mike Coleman dropped his Valium down to twice a day until three times a day and is requesting increase that.  I have explained to him I could not do so, but I will place our chart on Dr. Rosalyn Charters desk for him to review and have one of the nursing staff call back with his disposition.  The patient states he is still in group therapy.  He is slated to see an individual psychologist sometime in the near future.  He did not have the name but states he will keep that appointment.  He rates his average pain is 7-8, sharp, burning, stabbing, tingling pain.  The patient states he does not want to get back to the "safety was in prior to his three times a day Valium."  General activity level is 7-8.  Pain is same 24 hours a day. Sleep patterns are fair.  Pain is worse with walking, sitting, and standing.  Rest and medication tend to help.  He walks without assistance.  He walk about 25 minutes at a time.  He is unemployed.  He needs help with his household duties and shopping.  He states he would like to get back to school but he is afraid to groom his public contact.  REVIEW OF SYSTEMS:  Notable for difficulties as described above as well as some numbness and tremors, confusion, anxiety, no suicidal thoughts or aberrant behaviors.  SOCIAL HISTORY:  He has some nausea, constipation, limb swelling, shortness of breath from time to time, his anxiety states it is got worsening since he is again had his Valium reduced.  PAST MEDICAL HISTORY:  Unchanged.  SOCIAL HISTORY:  Lives alone.  FAMILY HISTORY:  Unchanged.  PHYSICAL EXAMINATION:  VITAL SIGNS:  His blood pressure is 133/70, pulse is 100, respirations 18, and O2 sats 93% on room air. NEUROLOGIC:  His motor strength is good in his upper and  lower extremities.  Constitutionally within normal limits.  He is alert and oriented x3.  His gait is normal.  He does seem somewhat shy today.  ASSESSMENT: 1. History of trauma with the left tib-fib fracture.  He has post-     traumatic arthritis and chronic pain. 2. Anxiety and depression.  PLAN: 1. He will continue with his group therapy and follow up with his     individual psychologists as this point approaches. 2. Refill oxycodone 50 mg 1-2  p.o. q.6 h. p.r.n. 90 with no refill. 3. Duragesic 100 mcg two transdermally every 48 hours 30 with no     refill. 4. He will follow up my clinic in 1 month.  I will place the chart of     Mike Coleman to make a disposition on his Valium.  His questions     otherwise were encouraged and answered.     Loveda Colaizzi L. Blima Dessert     Ranelle Oyster, M.D. Electronically Signed   RLW/MedQ D:  08/12/2011 13:47:56  T:  08/12/2011 16:01:32  Job #:  [id number]

## 2011-09-08 ENCOUNTER — Encounter: Payer: Medicaid Other | Attending: Neurosurgery | Admitting: Neurosurgery

## 2011-09-08 ENCOUNTER — Ambulatory Visit: Payer: Medicaid Other | Admitting: Neurosurgery

## 2011-09-08 DIAGNOSIS — F329 Major depressive disorder, single episode, unspecified: Secondary | ICD-10-CM | POA: Insufficient documentation

## 2011-09-08 DIAGNOSIS — F341 Dysthymic disorder: Secondary | ICD-10-CM

## 2011-09-08 DIAGNOSIS — G894 Chronic pain syndrome: Secondary | ICD-10-CM

## 2011-09-08 DIAGNOSIS — M542 Cervicalgia: Secondary | ICD-10-CM | POA: Insufficient documentation

## 2011-09-08 DIAGNOSIS — F411 Generalized anxiety disorder: Secondary | ICD-10-CM | POA: Insufficient documentation

## 2011-09-08 DIAGNOSIS — F3289 Other specified depressive episodes: Secondary | ICD-10-CM | POA: Insufficient documentation

## 2011-09-08 DIAGNOSIS — G8929 Other chronic pain: Secondary | ICD-10-CM | POA: Insufficient documentation

## 2011-09-08 DIAGNOSIS — M79609 Pain in unspecified limb: Secondary | ICD-10-CM | POA: Insufficient documentation

## 2011-09-08 DIAGNOSIS — M12569 Traumatic arthropathy, unspecified knee: Secondary | ICD-10-CM | POA: Insufficient documentation

## 2011-09-08 DIAGNOSIS — Z8781 Personal history of (healed) traumatic fracture: Secondary | ICD-10-CM | POA: Insufficient documentation

## 2011-09-08 NOTE — Assessment & Plan Note (Signed)
This is a patient of Dr. Riley Kill seen for diffuse back pain, lower extremity pain as well as some cervicalgia.  He does not report any change in his pain today.  He has been trying to get an appointment with the therapist that was scheduled to see him.  He remember the first name as Joy.  I will get the girls up front to work on this for him as he feels like he has been somewhat more anxious but overall pain has not changed.  He rates his pain as 6-8, it is a burning, stabbing, tingling, and aching pain.  General activity level is 7-9.  The pain is same 24 hours a day.  Sleep patterns not indicated.  Pain is worse with walking, bending and standing.  Rest, therapy and medication tend to help.  He walks without assistance.  He climb steps.  He cannot drive.  He walks about 25 minutes at a time without problem.  He is not employed.  REVIEW OF SYSTEMS:  Notable for difficulties as described above as well as some paresthesias, trouble walking, fusion, anxiety.  No suicidal thoughts.  No aberrant behaviors.  PAST MEDICAL HISTORY, SOCIAL HISTORY AND FAMILY HISTORY:  Unchanged.   Physical exam; blood pressure is 107/68, pulse 88, respirations 16, O2 sat 92 on room air.  His motor strength is 5/5 in the upper and lower extremities.  Sensation is intact.  Constitutionally, he is thin, he is alert and oriented x3.  Does appears somewhat depressed.  He has a normal gait.  ASSESSMENT: 1. History of trauma, left tib-fib fracture, posttraumatic arthritis,     chronic pain. 2. Anxiety, depression.  PLAN: 1. He will continue his group therapy and we will get him in with one     on one therapy as soon as possible. 2. Refill Duragesic 100 mcg 2 transdermally every 48 hours 30 with no     refill. 3. Oxycodone 15 mg 1-2 p.o. q.6 h. p.r.n. #90 with no refill. 4. Zoloft 50 mg 1 p.o. q.a.m. 30 with 3 refills. 5. Celebrex 200 mg once a day 30 with 3 refills.  His questions were encouraged and  answered.  We will see him back in clinic in 1 month.     Senita Corredor L. Blima Dessert Electronically Signed    RLW/MedQ D:  09/08/2011 15:13:28  T:  09/08/2011 22:30:10  Job #:  [id number]

## 2011-10-05 ENCOUNTER — Encounter: Payer: Medicaid Other | Attending: Neurosurgery | Admitting: Neurosurgery

## 2011-10-05 DIAGNOSIS — M1250 Traumatic arthropathy, unspecified site: Secondary | ICD-10-CM | POA: Insufficient documentation

## 2011-10-05 DIAGNOSIS — F329 Major depressive disorder, single episode, unspecified: Secondary | ICD-10-CM | POA: Insufficient documentation

## 2011-10-05 DIAGNOSIS — F3289 Other specified depressive episodes: Secondary | ICD-10-CM | POA: Insufficient documentation

## 2011-10-05 DIAGNOSIS — G8929 Other chronic pain: Secondary | ICD-10-CM | POA: Insufficient documentation

## 2011-10-05 DIAGNOSIS — F411 Generalized anxiety disorder: Secondary | ICD-10-CM | POA: Insufficient documentation

## 2011-10-05 DIAGNOSIS — M79609 Pain in unspecified limb: Secondary | ICD-10-CM | POA: Insufficient documentation

## 2011-10-05 DIAGNOSIS — F341 Dysthymic disorder: Secondary | ICD-10-CM

## 2011-10-05 DIAGNOSIS — M549 Dorsalgia, unspecified: Secondary | ICD-10-CM | POA: Insufficient documentation

## 2011-10-05 DIAGNOSIS — M542 Cervicalgia: Secondary | ICD-10-CM | POA: Insufficient documentation

## 2011-10-05 DIAGNOSIS — G894 Chronic pain syndrome: Secondary | ICD-10-CM

## 2011-10-06 NOTE — Assessment & Plan Note (Signed)
This is a patient of Dr. Riley Kill, seen for diffuse back pain as well as lower extremity pain, some cervicalgia.  He reports no change in his pain.  He rates it as 7-8.  It is a burning, stabbing, tingling pain. General activity level is 6 or 7.  Pain is same 24 hours a day.  Sleep patterns are poor.  Pain is worse with walking, bending, and standing; rest, therapy, and medication tend to help.  He walks without assistance.  He can walk up to 30 minutes at a time.  He does not drive. He is unemployed.  REVIEW OF SYSTEMS:  Notable for difficulties as described above as well as some numbness and anxiety and some wheezing.  No suicidal thoughts or aberrant behaviors.  Pill counts appear to be correct.  Last UDS consistent.  The patient states he is in group therapy and has an appointment made for one-to-one therapy in the near future.  Past medical history, social history, family history unchanged.  PHYSICAL EXAMINATION:  VITAL SIGNS:  His blood pressure 113/58, pulse 89, respirations 16, O2 sats 92 on room air. MUSCULOSKELETAL:  His motor strength sensation intact. PSYCHIATRIC:  Constitutionally is within normal limits.  He is alert and oriented x3.  Depression seems better.  ASSESSMENT: 1. History of trauma left tib-fib fracture, posttraumatic arthritis     chronic pain 2. Anxiety and depression.  PLAN: 1. Continue group therapy start one-to-one therapy in the near future. 2. Refill oxycodone 50 mg 1-2 p.o. q.6 hours p.r.n., 90 with no     refill. 3. Refill Duragesic 100 mcg 2 transdermally every 48 hours 30 with no     refill.  He will continue his Zoloft and Celebrex as scheduled.  We     will see him back here in 1 month.  His questions were encouraged     and answered.     Nuriyah Hanline L. Blima Dessert Electronically Signed    RLW/MedQ D:  10/05/2011 15:06:00  T:  10/06/2011 03:18:46  Job #:  [id number]

## 2011-11-03 ENCOUNTER — Encounter: Payer: Medicaid Other | Attending: Physical Medicine & Rehabilitation | Admitting: Physical Medicine & Rehabilitation

## 2011-11-03 DIAGNOSIS — F341 Dysthymic disorder: Secondary | ICD-10-CM

## 2011-11-03 DIAGNOSIS — G4700 Insomnia, unspecified: Secondary | ICD-10-CM | POA: Insufficient documentation

## 2011-11-03 DIAGNOSIS — X58XXXS Exposure to other specified factors, sequela: Secondary | ICD-10-CM | POA: Insufficient documentation

## 2011-11-03 DIAGNOSIS — S92309A Fracture of unspecified metatarsal bone(s), unspecified foot, initial encounter for closed fracture: Secondary | ICD-10-CM

## 2011-11-03 DIAGNOSIS — G894 Chronic pain syndrome: Secondary | ICD-10-CM

## 2011-11-03 DIAGNOSIS — S32409A Unspecified fracture of unspecified acetabulum, initial encounter for closed fracture: Secondary | ICD-10-CM

## 2011-11-03 DIAGNOSIS — S8290XS Unspecified fracture of unspecified lower leg, sequela: Secondary | ICD-10-CM | POA: Insufficient documentation

## 2011-11-03 DIAGNOSIS — M12569 Traumatic arthropathy, unspecified knee: Secondary | ICD-10-CM | POA: Insufficient documentation

## 2011-11-03 NOTE — Assessment & Plan Note (Signed)
HISTORY:  Mike Coleman is back regarding his multiple pain complaints.  He tells me he has been doing a bit better.  He had his visit with his psychologist a week or so ago.  They began to some planning for emotional and pain management.  He states that he has been trying to stay more active around the home.  He has tried to improve social interactions as well.  As a whole, he is doing a bit better.  Continues to have problems with sleep.  The trazodone does not work for him and often seems to interfere with sleep patterns he believes.  REVIEW OF SYSTEMS:  Notable for the above.  Full 12-point review is in the written health and history section of the chart.  SOCIAL HISTORY:  Noted above.  He is divorced and involved with his mom and dad and his son is still.  PHYSICAL EXAMINATION:  VITAL SIGNS:  Blood pressure is 125/81, pulse is 102, respiratory rate 16, and he is satting 89% on room air. GENERAL: The patient generally pleasant, appears a bit brighter today. He is still a bit disheveled.  He is slight of build.  He use all 4 limbs.  He still has some antalgia left more than right with gait.  ASSESSMENT: 1. History of left tib-fib fracture and posttraumatic arthritis with     chronic pain syndrome. 2. Significant anxiety with depression 3. Insomnia.  PLAN: 1. Discontinue trazodone, begin trial of Seroquel 12.5 mg 1-2 at     bedtime.  This may help with mood stability at nighttime and during     the day as well. 2. I am encouraged by his endeavors to improve his activity and social     interaction.  We continued to encourage him to work on the social     aspect of things.  This should be a focus of his psychological     management as well. 3. Continue Duragesic patch 100 mcg 2 q.48 hours #30, and oxycodone 15     mg, 1-2 q.6 hours p.r.n. #90. 4. I will see the patient back in about 6 months.  He will follow up     with the nurse practitioner in a month.     Ranelle Oyster,  M.D. Electronically Signed    ZTS/MedQ D:  11/03/2011 13:23:50  T:  11/03/2011 14:33:58  Job #:  [id number]

## 2011-11-30 ENCOUNTER — Encounter: Payer: Medicaid Other | Admitting: Neurosurgery

## 2011-12-24 ENCOUNTER — Encounter: Payer: Medicaid Other | Attending: Neurosurgery | Admitting: Neurosurgery

## 2011-12-24 DIAGNOSIS — F411 Generalized anxiety disorder: Secondary | ICD-10-CM | POA: Insufficient documentation

## 2011-12-24 DIAGNOSIS — G4700 Insomnia, unspecified: Secondary | ICD-10-CM | POA: Insufficient documentation

## 2011-12-24 DIAGNOSIS — G894 Chronic pain syndrome: Secondary | ICD-10-CM

## 2011-12-24 DIAGNOSIS — F341 Dysthymic disorder: Secondary | ICD-10-CM

## 2011-12-24 DIAGNOSIS — M12569 Traumatic arthropathy, unspecified knee: Secondary | ICD-10-CM | POA: Insufficient documentation

## 2011-12-24 DIAGNOSIS — F329 Major depressive disorder, single episode, unspecified: Secondary | ICD-10-CM | POA: Insufficient documentation

## 2011-12-24 DIAGNOSIS — F3289 Other specified depressive episodes: Secondary | ICD-10-CM | POA: Insufficient documentation

## 2011-12-24 DIAGNOSIS — M7989 Other specified soft tissue disorders: Secondary | ICD-10-CM | POA: Insufficient documentation

## 2011-12-24 DIAGNOSIS — G8921 Chronic pain due to trauma: Secondary | ICD-10-CM

## 2011-12-26 NOTE — Assessment & Plan Note (Signed)
This is a patient of Dr. Rosalyn Charters seen for multiple pain complaints as well as some significant anxiety and depression.  He reports his pain as 7-8.  It is a burning, stabbing, tingling, and aching pain.  General activity level is 8-9.  The pain is same 24 hours a day.  Sleep patterns are poor.  Pain is worse with walking and standing.  Rest and medication help.  He walks without assistance.  He can walk 15 minutes at a time. He climb steps.  He does not drive.  Functionally, he is unemployed and he needs help with household duties.  REVIEW OF SYSTEMS:  Notable for difficulties as described above as well as some limb swelling, shortness of breath, and anxiety.  No suicidal thoughts or aberrant behaviors.  Pill counts and UDS consistent.  PAST MEDICAL HISTORY/SOCIAL HISTORY/FAMILY HISTORY:  Unchanged.  PHYSICAL EXAMINATION:  VITAL SIGNS:  Blood pressure is 117/62, pulse 85, respirations 16, and O2 sats 85 on room air. NEUROLOGIC:  His motor strength and sensation are intact. CONSTITUTIONAL:  He is within normal limits.  He is oriented x3, somewhat depressed.  His gait is normal.  ASSESSMENT: 1. History of left tibiofibular fracture and posttraumatic arthritis. 2. Anxiety and depression. 3. Insomnia.  PLAN: 1. Refill Valium 5 mg 1 p.o. b.i.d., #60 with 3 refills. 2. Duragesic 100 mcg 2 transdermally every 48 hours, #30 with no     refill. 3. Oxycodone 15 mg 1-2 p.o. q.6 hours p.r.n., #90 with no refill.  His     questions were encouraged and answered.  Dr. Riley Kill will see him in     a month.     Donna Snooks L. Blima Dessert Electronically Signed    RLW/MedQ D:  12/24/2011 14:57:16  T:  12/26/2011 10:46:48  Job #:  [id number]

## 2012-01-19 ENCOUNTER — Encounter: Payer: Medicaid Other | Attending: Neurosurgery | Admitting: Neurosurgery

## 2012-01-19 DIAGNOSIS — F411 Generalized anxiety disorder: Secondary | ICD-10-CM | POA: Insufficient documentation

## 2012-01-19 DIAGNOSIS — F329 Major depressive disorder, single episode, unspecified: Secondary | ICD-10-CM | POA: Insufficient documentation

## 2012-01-19 DIAGNOSIS — G4700 Insomnia, unspecified: Secondary | ICD-10-CM | POA: Insufficient documentation

## 2012-01-19 DIAGNOSIS — F3289 Other specified depressive episodes: Secondary | ICD-10-CM | POA: Insufficient documentation

## 2012-01-19 DIAGNOSIS — G8921 Chronic pain due to trauma: Secondary | ICD-10-CM

## 2012-01-19 DIAGNOSIS — M12569 Traumatic arthropathy, unspecified knee: Secondary | ICD-10-CM | POA: Insufficient documentation

## 2012-01-19 DIAGNOSIS — F341 Dysthymic disorder: Secondary | ICD-10-CM

## 2012-01-20 NOTE — Assessment & Plan Note (Signed)
This is a patient of Dr. Riley Kill seen for multiple pain complaints.  The patient has been due for UDS for some time but had not been able to urinate.  He was told that they would not give the scripts unless he provided the UDS and from what I understand he did.  He rates his pain as 7 or 8, average activity is 7 to an 8.  Pain is same 24 hours a day. Sleep patterns are poor.  Walking, sitting, standing tend to aggravate. Rest and medication helps.  He walks without assistance.  He climbs steps.  He does not drive.  Functionally he is on disability.  Needs help with ADLs.  REVIEW OF SYSTEMS:  Notable for difficulties described above as well as some night sweats, insomnia, numbness, depression, anxiety, coughing, shortness of breath.  No suicidal thoughts or aberrant behaviors to this point.  Current pill counts correct.  PAST MEDICAL HISTORY/SOCIAL HISTORY/FAMILY HISTORY:  Unchanged.  PHYSICAL EXAMINATION:  His blood pressure is 110/63, pulse 102, respirations 16, O2 sat 79 on room air.  Constitutionally he is disheveled.  He is alert and oriented x3.  He has a normal gait.  ASSESSMENT: 1. History of left tibia-fibular fracture with posttraumatic     arthritis. 2. Anxiety and depression 3. Insomnia.  PLAN: 1. After UDS, he can get a script for that Duragesic 100 mcg 2     transdermally every 48 hours, #30 with no refill 2. Oxycodone 15 mg, 1-2 p.o. q.6 hours p.r.n., #90 with no refills.     Questions were encouraged and answered.  He will follow up here in     a month.     Mike Coleman L. Blima Dessert Electronically Signed    RLW/MedQ D:  01/19/2012 15:58:54  T:  01/20/2012 08:37:44  Job #:  [id number]

## 2012-02-01 ENCOUNTER — Encounter: Payer: Self-pay | Admitting: Physical Medicine & Rehabilitation

## 2012-02-16 ENCOUNTER — Ambulatory Visit: Payer: Medicaid Other

## 2012-03-09 ENCOUNTER — Encounter (HOSPITAL_COMMUNITY): Payer: Self-pay | Admitting: *Deleted

## 2012-03-09 ENCOUNTER — Emergency Department (HOSPITAL_COMMUNITY)
Admission: EM | Admit: 2012-03-09 | Discharge: 2012-03-09 | Disposition: A | Discharge status: Home / Self Care | Payer: Medicaid Other | Attending: Emergency Medicine | Admitting: Emergency Medicine

## 2012-03-09 DIAGNOSIS — M2550 Pain in unspecified joint: Secondary | ICD-10-CM | POA: Insufficient documentation

## 2012-03-09 DIAGNOSIS — F17200 Nicotine dependence, unspecified, uncomplicated: Secondary | ICD-10-CM | POA: Insufficient documentation

## 2012-03-09 DIAGNOSIS — R7989 Other specified abnormal findings of blood chemistry: Secondary | ICD-10-CM | POA: Insufficient documentation

## 2012-03-09 DIAGNOSIS — F1910 Other psychoactive substance abuse, uncomplicated: Secondary | ICD-10-CM

## 2012-03-09 DIAGNOSIS — D696 Thrombocytopenia, unspecified: Secondary | ICD-10-CM | POA: Insufficient documentation

## 2012-03-09 DIAGNOSIS — E871 Hypo-osmolality and hyponatremia: Secondary | ICD-10-CM | POA: Insufficient documentation

## 2012-03-09 DIAGNOSIS — G8929 Other chronic pain: Secondary | ICD-10-CM | POA: Insufficient documentation

## 2012-03-09 DIAGNOSIS — E878 Other disorders of electrolyte and fluid balance, not elsewhere classified: Secondary | ICD-10-CM | POA: Insufficient documentation

## 2012-03-09 LAB — CBC
HCT: 45.3 % (ref 39.0–52.0)
Hemoglobin: 16.6 g/dL (ref 13.0–17.0)
MCH: 32.5 pg (ref 26.0–34.0)
MCHC: 36.6 g/dL — ABNORMAL HIGH (ref 30.0–36.0)
MCV: 88.8 fL (ref 78.0–100.0)

## 2012-03-09 LAB — RAPID URINE DRUG SCREEN, HOSP PERFORMED
Amphetamines: NOT DETECTED
Tetrahydrocannabinol: NOT DETECTED

## 2012-03-09 LAB — COMPREHENSIVE METABOLIC PANEL
ALT: 110 U/L — ABNORMAL HIGH (ref 0–53)
Albumin: 4 g/dL (ref 3.5–5.2)
Alkaline Phosphatase: 118 U/L — ABNORMAL HIGH (ref 39–117)
Potassium: 4.5 mEq/L (ref 3.5–5.1)
Sodium: 124 mEq/L — ABNORMAL LOW (ref 135–145)
Total Protein: 6.8 g/dL (ref 6.0–8.3)

## 2012-03-09 LAB — BASIC METABOLIC PANEL
BUN: 6 mg/dL (ref 6–23)
Calcium: 9.3 mg/dL (ref 8.4–10.5)
GFR calc non Af Amer: >90 mL/min (ref >90)
Glucose, Bld: 133 mg/dL — ABNORMAL HIGH (ref 70–99)

## 2012-03-09 NOTE — ED Notes (Signed)
Pt here with police. Pt states he has been sick. States he called his dad today and could not talk to him, states all he could do was moan into the phone. States his dad got concerned and had the police come to his house. States he has no plans to harm himself or others

## 2012-03-09 NOTE — ED Notes (Signed)
Brought to ER by deputy, in cuffs, for medical clearance.

## 2012-03-09 NOTE — Discharge Instructions (Signed)
You have several laboratory abnormalities, including a low sodium of 126, low chloride of 83, liver function tests are mildly elevated Platelet count is 60,000 which is low Make sure you he all meals [place] alcohol and drugs Go to the local health department next week you'll likely need further testing , and your blood test should be repeated Return here if you feel worse for any reason If you have any thought of harming yourself call 911 immediately

## 2012-03-09 NOTE — ED Notes (Signed)
Pt given dinner tray, [employer] at bedside,  
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
## 2012-03-09 NOTE — ED Notes (Signed)
Pt doing tele-psych.  

## 2015-02-20 DIAGNOSIS — E663 Overweight: Secondary | ICD-10-CM | POA: Diagnosis not present

## 2015-02-20 DIAGNOSIS — Z6829 Body mass index (BMI) 29.0-29.9, adult: Secondary | ICD-10-CM | POA: Diagnosis not present

## 2015-02-20 DIAGNOSIS — G8921 Chronic pain due to trauma: Secondary | ICD-10-CM | POA: Diagnosis not present

## 2015-02-20 DIAGNOSIS — H0263 Xanthelasma of right eye, unspecified eyelid: Secondary | ICD-10-CM | POA: Diagnosis not present

## 2015-02-21 DIAGNOSIS — Z13220 Encounter for screening for lipoid disorders: Secondary | ICD-10-CM | POA: Diagnosis not present

## 2015-02-21 DIAGNOSIS — R7301 Impaired fasting glucose: Secondary | ICD-10-CM | POA: Diagnosis not present

## 2015-02-21 DIAGNOSIS — Z6829 Body mass index (BMI) 29.0-29.9, adult: Secondary | ICD-10-CM | POA: Diagnosis not present

## 2015-02-21 DIAGNOSIS — E663 Overweight: Secondary | ICD-10-CM | POA: Diagnosis not present

## 2015-02-24 DIAGNOSIS — R7301 Impaired fasting glucose: Secondary | ICD-10-CM | POA: Diagnosis not present

## 2015-02-24 DIAGNOSIS — Z13220 Encounter for screening for lipoid disorders: Secondary | ICD-10-CM | POA: Diagnosis not present

## 2015-02-25 DIAGNOSIS — M25551 Pain in right hip: Secondary | ICD-10-CM | POA: Diagnosis not present

## 2015-02-25 DIAGNOSIS — R270 Ataxia, unspecified: Secondary | ICD-10-CM | POA: Diagnosis not present

## 2015-02-25 DIAGNOSIS — M79671 Pain in right foot: Secondary | ICD-10-CM | POA: Diagnosis not present

## 2015-02-25 DIAGNOSIS — M25552 Pain in left hip: Secondary | ICD-10-CM | POA: Diagnosis not present

## 2015-03-10 DIAGNOSIS — D696 Thrombocytopenia, unspecified: Secondary | ICD-10-CM | POA: Diagnosis not present

## 2015-03-19 DIAGNOSIS — R635 Abnormal weight gain: Secondary | ICD-10-CM | POA: Diagnosis not present

## 2015-03-19 DIAGNOSIS — R05 Cough: Secondary | ICD-10-CM | POA: Diagnosis not present

## 2015-03-19 DIAGNOSIS — D696 Thrombocytopenia, unspecified: Secondary | ICD-10-CM | POA: Diagnosis not present

## 2015-03-21 DIAGNOSIS — R635 Abnormal weight gain: Secondary | ICD-10-CM | POA: Diagnosis not present

## 2015-03-21 DIAGNOSIS — J449 Chronic obstructive pulmonary disease, unspecified: Secondary | ICD-10-CM | POA: Diagnosis not present

## 2015-03-21 DIAGNOSIS — K760 Fatty (change of) liver, not elsewhere classified: Secondary | ICD-10-CM | POA: Diagnosis not present

## 2015-03-21 DIAGNOSIS — R05 Cough: Secondary | ICD-10-CM | POA: Diagnosis not present

## 2015-03-21 DIAGNOSIS — D696 Thrombocytopenia, unspecified: Secondary | ICD-10-CM | POA: Diagnosis not present

## 2015-06-10 DIAGNOSIS — E291 Testicular hypofunction: Secondary | ICD-10-CM | POA: Diagnosis not present

## 2015-06-10 DIAGNOSIS — H0261 Xanthelasma of right upper eyelid: Secondary | ICD-10-CM | POA: Diagnosis not present

## 2015-06-10 DIAGNOSIS — N529 Male erectile dysfunction, unspecified: Secondary | ICD-10-CM | POA: Diagnosis not present

## 2015-06-10 DIAGNOSIS — Z0001 Encounter for general adult medical examination with abnormal findings: Secondary | ICD-10-CM | POA: Diagnosis not present

## 2015-06-10 DIAGNOSIS — Z6830 Body mass index (BMI) 30.0-30.9, adult: Secondary | ICD-10-CM | POA: Diagnosis not present

## 2015-06-10 DIAGNOSIS — F17290 Nicotine dependence, other tobacco product, uncomplicated: Secondary | ICD-10-CM | POA: Diagnosis not present

## 2015-06-10 DIAGNOSIS — J449 Chronic obstructive pulmonary disease, unspecified: Secondary | ICD-10-CM | POA: Diagnosis not present

## 2015-06-18 ENCOUNTER — Other Ambulatory Visit (HOSPITAL_COMMUNITY): Payer: Self-pay | Admitting: Family Medicine

## 2015-06-18 DIAGNOSIS — R221 Localized swelling, mass and lump, neck: Principal | ICD-10-CM

## 2015-06-18 DIAGNOSIS — R220 Localized swelling, mass and lump, head: Secondary | ICD-10-CM

## 2015-06-20 ENCOUNTER — Ambulatory Visit (HOSPITAL_COMMUNITY): Payer: Medicaid Other

## 2015-06-23 ENCOUNTER — Ambulatory Visit (HOSPITAL_COMMUNITY)
Admission: RE | Admit: 2015-06-23 | Discharge: 2015-06-23 | Disposition: A | Discharge status: Home / Self Care | Payer: Medicare Other | Source: Ambulatory Visit | Attending: Family Medicine | Admitting: Family Medicine

## 2015-06-23 DIAGNOSIS — Z0389 Encounter for observation for other suspected diseases and conditions ruled out: Secondary | ICD-10-CM | POA: Diagnosis not present

## 2015-06-23 DIAGNOSIS — R221 Localized swelling, mass and lump, neck: Secondary | ICD-10-CM | POA: Diagnosis not present

## 2015-06-23 DIAGNOSIS — R220 Localized swelling, mass and lump, head: Secondary | ICD-10-CM

## 2015-06-26 DIAGNOSIS — Z6830 Body mass index (BMI) 30.0-30.9, adult: Secondary | ICD-10-CM | POA: Diagnosis not present

## 2015-06-26 DIAGNOSIS — Z1389 Encounter for screening for other disorder: Secondary | ICD-10-CM | POA: Diagnosis not present

## 2015-06-26 DIAGNOSIS — R221 Localized swelling, mass and lump, neck: Secondary | ICD-10-CM | POA: Diagnosis not present

## 2015-06-26 DIAGNOSIS — E6609 Other obesity due to excess calories: Secondary | ICD-10-CM | POA: Diagnosis not present

## 2015-07-22 DIAGNOSIS — R070 Pain in throat: Secondary | ICD-10-CM | POA: Diagnosis not present

## 2015-09-11 DIAGNOSIS — H0260 Xanthelasma of unspecified eye, unspecified eyelid: Secondary | ICD-10-CM | POA: Diagnosis not present

## 2015-09-23 DIAGNOSIS — K760 Fatty (change of) liver, not elsewhere classified: Secondary | ICD-10-CM | POA: Diagnosis not present

## 2015-09-23 DIAGNOSIS — Z720 Tobacco use: Secondary | ICD-10-CM | POA: Diagnosis not present

## 2015-09-23 DIAGNOSIS — D696 Thrombocytopenia, unspecified: Secondary | ICD-10-CM | POA: Diagnosis not present

## 2015-09-23 DIAGNOSIS — E663 Overweight: Secondary | ICD-10-CM | POA: Diagnosis not present

## 2015-10-02 DIAGNOSIS — Z029 Encounter for administrative examinations, unspecified: Secondary | ICD-10-CM | POA: Diagnosis not present

## 2015-10-09 DIAGNOSIS — Z4802 Encounter for removal of sutures: Secondary | ICD-10-CM | POA: Diagnosis not present

## 2015-12-15 DIAGNOSIS — E663 Overweight: Secondary | ICD-10-CM | POA: Diagnosis not present

## 2015-12-15 DIAGNOSIS — Z1389 Encounter for screening for other disorder: Secondary | ICD-10-CM | POA: Diagnosis not present

## 2015-12-15 DIAGNOSIS — Z6829 Body mass index (BMI) 29.0-29.9, adult: Secondary | ICD-10-CM | POA: Diagnosis not present

## 2015-12-15 DIAGNOSIS — N50812 Left testicular pain: Secondary | ICD-10-CM | POA: Diagnosis not present

## 2015-12-15 DIAGNOSIS — Z23 Encounter for immunization: Secondary | ICD-10-CM | POA: Diagnosis not present

## 2016-01-12 DIAGNOSIS — Z1389 Encounter for screening for other disorder: Secondary | ICD-10-CM | POA: Diagnosis not present

## 2016-01-12 DIAGNOSIS — G894 Chronic pain syndrome: Secondary | ICD-10-CM | POA: Diagnosis not present

## 2016-01-12 DIAGNOSIS — Z6830 Body mass index (BMI) 30.0-30.9, adult: Secondary | ICD-10-CM | POA: Diagnosis not present

## 2016-01-12 DIAGNOSIS — J449 Chronic obstructive pulmonary disease, unspecified: Secondary | ICD-10-CM | POA: Diagnosis not present

## 2016-01-12 DIAGNOSIS — L0292 Furuncle, unspecified: Secondary | ICD-10-CM | POA: Diagnosis not present

## 2016-01-16 DIAGNOSIS — Z1389 Encounter for screening for other disorder: Secondary | ICD-10-CM | POA: Diagnosis not present

## 2016-01-16 DIAGNOSIS — E6609 Other obesity due to excess calories: Secondary | ICD-10-CM | POA: Diagnosis not present

## 2016-01-16 DIAGNOSIS — Z6830 Body mass index (BMI) 30.0-30.9, adult: Secondary | ICD-10-CM | POA: Diagnosis not present

## 2016-01-16 DIAGNOSIS — A609 Anogenital herpesviral infection, unspecified: Secondary | ICD-10-CM | POA: Diagnosis not present

## 2016-01-22 ENCOUNTER — Other Ambulatory Visit (INDEPENDENT_AMBULATORY_CARE_PROVIDER_SITE_OTHER): Payer: Self-pay | Admitting: Otolaryngology

## 2016-01-22 ENCOUNTER — Ambulatory Visit (INDEPENDENT_AMBULATORY_CARE_PROVIDER_SITE_OTHER): Payer: Medicare Other | Admitting: Otolaryngology

## 2016-01-22 DIAGNOSIS — R070 Pain in throat: Secondary | ICD-10-CM | POA: Diagnosis not present

## 2016-01-22 DIAGNOSIS — R221 Localized swelling, mass and lump, neck: Secondary | ICD-10-CM

## 2016-01-22 DIAGNOSIS — D487 Neoplasm of uncertain behavior of other specified sites: Secondary | ICD-10-CM | POA: Diagnosis not present

## 2016-01-27 ENCOUNTER — Ambulatory Visit (HOSPITAL_COMMUNITY)
Admission: RE | Admit: 2016-01-27 | Discharge: 2016-01-27 | Disposition: A | Discharge status: Home / Self Care | Payer: Medicare Other | Source: Ambulatory Visit | Attending: Otolaryngology | Admitting: Otolaryngology

## 2016-01-27 DIAGNOSIS — R221 Localized swelling, mass and lump, neck: Secondary | ICD-10-CM

## 2016-01-27 DIAGNOSIS — M12812 Other specific arthropathies, not elsewhere classified, left shoulder: Secondary | ICD-10-CM | POA: Insufficient documentation

## 2016-01-27 MED ORDER — IOHEXOL 300 MG/ML  SOLN
75.0000 mL | Freq: Once | INTRAMUSCULAR | Status: AC | PRN
  Administered 2016-01-27: 75 mL via INTRAVENOUS

## 2016-02-05 DIAGNOSIS — Z1389 Encounter for screening for other disorder: Secondary | ICD-10-CM | POA: Diagnosis not present

## 2016-02-05 DIAGNOSIS — E6609 Other obesity due to excess calories: Secondary | ICD-10-CM | POA: Diagnosis not present

## 2016-02-05 DIAGNOSIS — J449 Chronic obstructive pulmonary disease, unspecified: Secondary | ICD-10-CM | POA: Diagnosis not present

## 2016-02-05 DIAGNOSIS — Z6830 Body mass index (BMI) 30.0-30.9, adult: Secondary | ICD-10-CM | POA: Diagnosis not present

## 2016-02-05 DIAGNOSIS — B009 Herpesviral infection, unspecified: Secondary | ICD-10-CM | POA: Diagnosis not present

## 2016-03-22 DIAGNOSIS — Z1389 Encounter for screening for other disorder: Secondary | ICD-10-CM | POA: Diagnosis not present

## 2016-03-22 DIAGNOSIS — Z6830 Body mass index (BMI) 30.0-30.9, adult: Secondary | ICD-10-CM | POA: Diagnosis not present

## 2016-03-22 DIAGNOSIS — E669 Obesity, unspecified: Secondary | ICD-10-CM | POA: Diagnosis not present

## 2016-03-22 DIAGNOSIS — E6609 Other obesity due to excess calories: Secondary | ICD-10-CM | POA: Diagnosis not present

## 2016-03-22 DIAGNOSIS — F17290 Nicotine dependence, other tobacco product, uncomplicated: Secondary | ICD-10-CM | POA: Diagnosis not present

## 2016-03-22 DIAGNOSIS — G894 Chronic pain syndrome: Secondary | ICD-10-CM | POA: Diagnosis not present

## 2016-03-23 DIAGNOSIS — K760 Fatty (change of) liver, not elsewhere classified: Secondary | ICD-10-CM | POA: Diagnosis not present

## 2016-03-23 DIAGNOSIS — D696 Thrombocytopenia, unspecified: Secondary | ICD-10-CM | POA: Diagnosis not present

## 2016-04-30 DIAGNOSIS — R079 Chest pain, unspecified: Secondary | ICD-10-CM | POA: Diagnosis not present

## 2016-04-30 DIAGNOSIS — Z1389 Encounter for screening for other disorder: Secondary | ICD-10-CM | POA: Diagnosis not present

## 2016-04-30 DIAGNOSIS — Z6830 Body mass index (BMI) 30.0-30.9, adult: Secondary | ICD-10-CM | POA: Diagnosis not present

## 2016-04-30 DIAGNOSIS — J449 Chronic obstructive pulmonary disease, unspecified: Secondary | ICD-10-CM | POA: Diagnosis not present

## 2016-04-30 DIAGNOSIS — R531 Weakness: Secondary | ICD-10-CM | POA: Diagnosis not present

## 2016-04-30 DIAGNOSIS — M79671 Pain in right foot: Secondary | ICD-10-CM | POA: Diagnosis not present

## 2016-05-17 DIAGNOSIS — R531 Weakness: Secondary | ICD-10-CM | POA: Diagnosis not present

## 2016-05-17 DIAGNOSIS — R5383 Other fatigue: Secondary | ICD-10-CM | POA: Diagnosis not present

## 2016-05-17 DIAGNOSIS — Z720 Tobacco use: Secondary | ICD-10-CM | POA: Diagnosis not present

## 2016-05-17 DIAGNOSIS — D696 Thrombocytopenia, unspecified: Secondary | ICD-10-CM | POA: Diagnosis not present

## 2016-06-16 DIAGNOSIS — Z125 Encounter for screening for malignant neoplasm of prostate: Secondary | ICD-10-CM | POA: Diagnosis not present

## 2016-06-16 DIAGNOSIS — G894 Chronic pain syndrome: Secondary | ICD-10-CM | POA: Diagnosis not present

## 2016-06-16 DIAGNOSIS — Z0001 Encounter for general adult medical examination with abnormal findings: Secondary | ICD-10-CM | POA: Diagnosis not present

## 2016-06-16 DIAGNOSIS — Z1389 Encounter for screening for other disorder: Secondary | ICD-10-CM | POA: Diagnosis not present

## 2016-06-16 DIAGNOSIS — E6609 Other obesity due to excess calories: Secondary | ICD-10-CM | POA: Diagnosis not present

## 2016-06-16 DIAGNOSIS — N5089 Other specified disorders of the male genital organs: Secondary | ICD-10-CM | POA: Diagnosis not present

## 2016-06-16 DIAGNOSIS — R5383 Other fatigue: Secondary | ICD-10-CM | POA: Diagnosis not present

## 2016-06-16 DIAGNOSIS — E291 Testicular hypofunction: Secondary | ICD-10-CM | POA: Diagnosis not present

## 2016-06-16 DIAGNOSIS — I861 Scrotal varices: Secondary | ICD-10-CM | POA: Diagnosis not present

## 2016-06-16 DIAGNOSIS — Z6830 Body mass index (BMI) 30.0-30.9, adult: Secondary | ICD-10-CM | POA: Diagnosis not present

## 2016-07-07 ENCOUNTER — Other Ambulatory Visit (HOSPITAL_COMMUNITY): Payer: Self-pay | Admitting: Registered Nurse

## 2016-07-07 DIAGNOSIS — I861 Scrotal varices: Secondary | ICD-10-CM

## 2016-07-09 ENCOUNTER — Other Ambulatory Visit (HOSPITAL_COMMUNITY): Payer: Self-pay | Admitting: Registered Nurse

## 2016-07-09 DIAGNOSIS — I861 Scrotal varices: Secondary | ICD-10-CM

## 2016-07-09 DIAGNOSIS — N50819 Testicular pain, unspecified: Secondary | ICD-10-CM

## 2016-07-16 DIAGNOSIS — R7989 Other specified abnormal findings of blood chemistry: Secondary | ICD-10-CM | POA: Diagnosis not present

## 2016-08-19 ENCOUNTER — Other Ambulatory Visit (HOSPITAL_COMMUNITY): Payer: Self-pay | Admitting: Registered Nurse

## 2016-08-19 DIAGNOSIS — I861 Scrotal varices: Secondary | ICD-10-CM

## 2016-08-23 ENCOUNTER — Ambulatory Visit (HOSPITAL_COMMUNITY)
Admission: RE | Admit: 2016-08-23 | Discharge: 2016-08-23 | Disposition: A | Discharge status: Home / Self Care | Payer: Medicare Other | Source: Ambulatory Visit | Attending: Registered Nurse | Admitting: Registered Nurse

## 2016-08-23 DIAGNOSIS — I861 Scrotal varices: Secondary | ICD-10-CM | POA: Diagnosis not present

## 2016-08-23 DIAGNOSIS — N50812 Left testicular pain: Secondary | ICD-10-CM | POA: Diagnosis not present

## 2016-10-25 ENCOUNTER — Encounter (HOSPITAL_COMMUNITY): Payer: Medicare Other | Attending: Oncology | Admitting: Hematology & Oncology

## 2016-10-25 ENCOUNTER — Encounter (HOSPITAL_COMMUNITY): Payer: Self-pay | Admitting: Hematology & Oncology

## 2016-10-25 VITALS — BP 136/79 | HR 76 | Temp 98.9°F | Resp 16 | Ht 67.25 in | Wt 203.8 lb

## 2016-10-25 DIAGNOSIS — Z23 Encounter for immunization: Secondary | ICD-10-CM

## 2016-10-25 DIAGNOSIS — D696 Thrombocytopenia, unspecified: Secondary | ICD-10-CM

## 2016-10-25 DIAGNOSIS — K760 Fatty (change of) liver, not elsewhere classified: Secondary | ICD-10-CM | POA: Diagnosis not present

## 2016-10-25 DIAGNOSIS — K769 Liver disease, unspecified: Secondary | ICD-10-CM

## 2016-10-25 DIAGNOSIS — Z720 Tobacco use: Secondary | ICD-10-CM

## 2016-10-25 DIAGNOSIS — K766 Portal hypertension: Secondary | ICD-10-CM | POA: Diagnosis not present

## 2016-10-25 DIAGNOSIS — D739 Disease of spleen, unspecified: Secondary | ICD-10-CM

## 2016-10-25 DIAGNOSIS — Z0000 Encounter for general adult medical examination without abnormal findings: Secondary | ICD-10-CM

## 2016-10-25 LAB — CBC WITH DIFFERENTIAL/PLATELET
Basophils Absolute: 0.1 10*3/uL (ref 0.0–0.1)
Basophils Relative: 1 %
EOS PCT: 4 %
Eosinophils Absolute: 0.4 10*3/uL (ref 0.0–0.7)
HEMATOCRIT: 46.3 % (ref 39.0–52.0)
HEMOGLOBIN: 16.3 g/dL (ref 13.0–17.0)
LYMPHS ABS: 2.5 10*3/uL (ref 0.7–4.0)
Lymphocytes Relative: 25 %
MCH: 31.6 pg (ref 26.0–34.0)
MCHC: 35.2 g/dL (ref 30.0–36.0)
MCV: 89.7 fL (ref 78.0–100.0)
MONOS PCT: 7 %
Monocytes Absolute: 0.7 10*3/uL (ref 0.1–1.0)
Neutro Abs: 6.3 10*3/uL (ref 1.7–7.7)
Neutrophils Relative %: 63 %
Platelets: 94 10*3/uL — ABNORMAL LOW (ref 150–400)
RBC: 5.16 MIL/uL (ref 4.22–5.81)
RDW: 13.7 % (ref 11.5–15.5)
WBC: 10 10*3/uL (ref 4.0–10.5)

## 2016-10-25 LAB — COMPREHENSIVE METABOLIC PANEL
ALBUMIN: 4.5 g/dL (ref 3.5–5.0)
ALK PHOS: 89 U/L (ref 38–126)
ALT: 28 U/L (ref 17–63)
AST: 24 U/L (ref 15–41)
Anion gap: 9 (ref 5–15)
BILIRUBIN TOTAL: 0.7 mg/dL (ref 0.3–1.2)
BUN: 17 mg/dL (ref 6–20)
CALCIUM: 9.3 mg/dL (ref 8.9–10.3)
CO2: 24 mmol/L (ref 22–32)
CREATININE: 1.11 mg/dL (ref 0.61–1.24)
Chloride: 104 mmol/L (ref 101–111)
GFR calc Af Amer: >60 mL/min (ref >60)
GFR calc non Af Amer: >60 mL/min (ref >60)
GLUCOSE: 83 mg/dL (ref 65–99)
Potassium: 3.7 mmol/L (ref 3.5–5.1)
Sodium: 137 mmol/L (ref 135–145)
TOTAL PROTEIN: 7.4 g/dL (ref 6.5–8.1)

## 2016-10-25 MED ORDER — INFLUENZA VAC SPLIT QUAD 0.5 ML IM SUSY
PREFILLED_SYRINGE | INTRAMUSCULAR | Status: AC
  Filled 2016-10-25: qty 0.5

## 2016-10-25 MED ORDER — INFLUENZA VAC SPLIT QUAD 0.5 ML IM SUSY
0.5000 mL | PREFILLED_SYRINGE | Freq: Once | INTRAMUSCULAR | Status: AC
  Administered 2016-10-25: 0.5 mL via INTRAMUSCULAR
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
## 2016-10-25 NOTE — Progress Notes (Signed)
After drawing labs at 1645, patient stated he did not feel well and felt like he was going to pass out. Patient became unresponsive , pale, sweating but had pulse. Rapid response called at 1650. O2 applied via [place] at 3lpm. BP was 100/67, pulse 43, o2sat 95. Patient slowly regained consciousness. His color slowly returned to normal and he was talking coherently. Patient stated he did not want to go to ER. Recheck vitals were 120/69, 62, and 95 o2 sat. Patient is eating and drinking and states he feels better. Final vitals are BP 110/58, pulse 70 and O2 sat 97.patient discharged ambulatory in stable condition.

## 2016-10-25 NOTE — Progress Notes (Signed)
Mike Coleman presents today for injection per MD orders. Flu Vaccine administered IM in right Upper Arm. Administration without incident. Patient tolerated well.

## 2016-10-25 NOTE — Patient Instructions (Addendum)
[place] at [facility] Discharge Instructions  RECOMMENDATIONS MADE BY THE CONSULTANT AND ANY TEST RESULTS WILL BE SENT TO YOUR REFERRING PHYSICIAN.  You saw Dr.Penland today. Labs today- we will call you with results. Flu shot today. Follow up in 6 months with labs. See Amy at checkout for appointments.  Thank you for choosing [place] at [facility] to provide your oncology and hematology care.  To afford each patient quality time with our provider, please arrive at least 15 minutes before your scheduled appointment time.   Beginning January 23rd 2017 lab work for the [facility] will be done in the  Main lab at [facility] on 1st floor. If you have a lab appointment with the [facility] please come in thru the  Main Entrance and check in at the main information desk  You need to re-schedule your appointment should you arrive 10 or more minutes late.  We strive to give you quality time with our providers, and arriving late affects you and other patients whose appointments are after yours.  Also, if you no show three or more times for appointments you may be dismissed from the clinic at the providers discretion.     Again, thank you for choosing [facility]  Our hope is that these requests will decrease the amount of time that you wait before being seen by our physicians.       _____________________________________________________________  Should you have questions after your visit to [facility], please contact our office at [phone] between the hours of 8:30 a.m. and 4:30 p.m.  Voicemails left after 4:30 p.m. will not be returned until the following business day.  For prescription refill requests, have your pharmacy contact our office.         Resources For Cancer Patients and their Caregivers ? American Cancer Society: Can assist with transportation, wigs, general needs, runs Look Good Feel  Better.        (667)308-8345 ? Cancer Care: Provides financial assistance, online support groups, medication/co-pay assistance.  1-800-813-HOPE 442-720-3310) ? [facility] Assists [place] cancer patients and their families through emotional , educational and financial support.  819-354-8058 ? [place] DSS Where to apply for food stamps, Medicaid and utility assistance. 747-508-7538 ? [facility]: Transportation to medical appointments. 787-435-7767 ? Social Security Administration: May apply for disability if have a Stage IV cancer. (254) 716-0404 628 131 4362 ? [employer], Disability and Transit Services: Assists with nutrition, care and transit needs. [place] Support Programs: @10RELATIVEDAYS @ > Cancer Support Group  2nd Tuesday of the month 1pm-2pm, Journey Room  > Creative Journey  3rd Tuesday of the month 1130am-1pm, Journey Room  > Look Good Feel Better  1st Wednesday of the month 10am-12 noon, Journey Room (Call [place] to register [phone])

## 2016-11-22 ENCOUNTER — Encounter (HOSPITAL_COMMUNITY): Payer: Self-pay | Admitting: Hematology & Oncology

## 2016-12-24 DIAGNOSIS — E6609 Other obesity due to excess calories: Secondary | ICD-10-CM | POA: Diagnosis not present

## 2016-12-24 DIAGNOSIS — M2550 Pain in unspecified joint: Secondary | ICD-10-CM | POA: Diagnosis not present

## 2016-12-24 DIAGNOSIS — Z1389 Encounter for screening for other disorder: Secondary | ICD-10-CM | POA: Diagnosis not present

## 2016-12-24 DIAGNOSIS — E291 Testicular hypofunction: Secondary | ICD-10-CM | POA: Diagnosis not present

## 2016-12-24 DIAGNOSIS — Z6831 Body mass index (BMI) 31.0-31.9, adult: Secondary | ICD-10-CM | POA: Diagnosis not present

## 2016-12-24 DIAGNOSIS — E669 Obesity, unspecified: Secondary | ICD-10-CM | POA: Diagnosis not present

## 2017-04-25 ENCOUNTER — Other Ambulatory Visit (HOSPITAL_COMMUNITY): Payer: Medicare Other

## 2017-04-25 ENCOUNTER — Ambulatory Visit (HOSPITAL_COMMUNITY): Payer: Medicare Other

## 2017-09-30 DIAGNOSIS — Z0001 Encounter for general adult medical examination with abnormal findings: Secondary | ICD-10-CM | POA: Diagnosis not present

## 2017-09-30 DIAGNOSIS — Z1389 Encounter for screening for other disorder: Secondary | ICD-10-CM | POA: Diagnosis not present

## 2017-09-30 DIAGNOSIS — Z6831 Body mass index (BMI) 31.0-31.9, adult: Secondary | ICD-10-CM | POA: Diagnosis not present

## 2017-09-30 DIAGNOSIS — E6609 Other obesity due to excess calories: Secondary | ICD-10-CM | POA: Diagnosis not present

## 2017-09-30 DIAGNOSIS — Z23 Encounter for immunization: Secondary | ICD-10-CM | POA: Diagnosis not present

## 2017-09-30 DIAGNOSIS — E291 Testicular hypofunction: Secondary | ICD-10-CM | POA: Diagnosis not present

## 2017-09-30 DIAGNOSIS — R69 Illness, unspecified: Secondary | ICD-10-CM | POA: Diagnosis not present

## 2017-09-30 DIAGNOSIS — R7309 Other abnormal glucose: Secondary | ICD-10-CM | POA: Diagnosis not present

## 2017-09-30 DIAGNOSIS — G8921 Chronic pain due to trauma: Secondary | ICD-10-CM | POA: Diagnosis not present

## 2017-10-18 DIAGNOSIS — Z0001 Encounter for general adult medical examination with abnormal findings: Secondary | ICD-10-CM | POA: Diagnosis not present

## 2017-10-18 DIAGNOSIS — Z1389 Encounter for screening for other disorder: Secondary | ICD-10-CM | POA: Diagnosis not present

## 2017-10-18 DIAGNOSIS — Z23 Encounter for immunization: Secondary | ICD-10-CM | POA: Diagnosis not present

## 2017-10-20 ENCOUNTER — Ambulatory Visit (HOSPITAL_COMMUNITY)
Admission: RE | Admit: 2017-10-20 | Discharge: 2017-10-20 | Disposition: A | Discharge status: Home / Self Care | Payer: Medicare HMO | Source: Ambulatory Visit | Attending: Family Medicine | Admitting: Family Medicine

## 2017-10-20 ENCOUNTER — Other Ambulatory Visit (HOSPITAL_COMMUNITY): Payer: Self-pay | Admitting: Family Medicine

## 2017-10-20 DIAGNOSIS — Z1389 Encounter for screening for other disorder: Secondary | ICD-10-CM | POA: Insufficient documentation

## 2017-10-20 DIAGNOSIS — R0602 Shortness of breath: Secondary | ICD-10-CM | POA: Diagnosis not present

## 2017-10-20 DIAGNOSIS — J40 Bronchitis, not specified as acute or chronic: Secondary | ICD-10-CM | POA: Insufficient documentation

## 2017-10-20 DIAGNOSIS — Z23 Encounter for immunization: Secondary | ICD-10-CM

## 2017-11-10 DIAGNOSIS — G2581 Restless legs syndrome: Secondary | ICD-10-CM | POA: Diagnosis not present

## 2017-11-10 DIAGNOSIS — E6609 Other obesity due to excess calories: Secondary | ICD-10-CM | POA: Diagnosis not present

## 2017-11-10 DIAGNOSIS — Z6831 Body mass index (BMI) 31.0-31.9, adult: Secondary | ICD-10-CM | POA: Diagnosis not present

## 2017-11-10 DIAGNOSIS — G4730 Sleep apnea, unspecified: Secondary | ICD-10-CM | POA: Diagnosis not present

## 2017-12-16 DIAGNOSIS — M25562 Pain in left knee: Secondary | ICD-10-CM | POA: Diagnosis not present

## 2018-01-09 DIAGNOSIS — H5213 Myopia, bilateral: Secondary | ICD-10-CM | POA: Diagnosis not present

## 2018-02-15 DIAGNOSIS — I10 Essential (primary) hypertension: Secondary | ICD-10-CM | POA: Diagnosis not present

## 2018-02-15 DIAGNOSIS — E119 Type 2 diabetes mellitus without complications: Secondary | ICD-10-CM | POA: Diagnosis not present

## 2018-02-15 DIAGNOSIS — R69 Illness, unspecified: Secondary | ICD-10-CM | POA: Diagnosis not present

## 2018-03-23 DIAGNOSIS — R69 Illness, unspecified: Secondary | ICD-10-CM | POA: Diagnosis not present

## 2018-03-23 DIAGNOSIS — N529 Male erectile dysfunction, unspecified: Secondary | ICD-10-CM | POA: Diagnosis not present

## 2018-03-23 DIAGNOSIS — Z6831 Body mass index (BMI) 31.0-31.9, adult: Secondary | ICD-10-CM | POA: Diagnosis not present

## 2018-03-23 DIAGNOSIS — J449 Chronic obstructive pulmonary disease, unspecified: Secondary | ICD-10-CM | POA: Diagnosis not present

## 2018-03-23 DIAGNOSIS — E6609 Other obesity due to excess calories: Secondary | ICD-10-CM | POA: Diagnosis not present

## 2018-03-23 DIAGNOSIS — R590 Localized enlarged lymph nodes: Secondary | ICD-10-CM | POA: Diagnosis not present

## 2018-10-05 DIAGNOSIS — Z23 Encounter for immunization: Secondary | ICD-10-CM | POA: Diagnosis not present

## 2018-10-05 DIAGNOSIS — G8921 Chronic pain due to trauma: Secondary | ICD-10-CM | POA: Diagnosis not present

## 2018-10-05 DIAGNOSIS — Z6831 Body mass index (BMI) 31.0-31.9, adult: Secondary | ICD-10-CM | POA: Diagnosis not present

## 2018-10-05 DIAGNOSIS — Z0001 Encounter for general adult medical examination with abnormal findings: Secondary | ICD-10-CM | POA: Diagnosis not present

## 2018-10-05 DIAGNOSIS — G2581 Restless legs syndrome: Secondary | ICD-10-CM | POA: Diagnosis not present

## 2018-10-05 DIAGNOSIS — Z1389 Encounter for screening for other disorder: Secondary | ICD-10-CM | POA: Diagnosis not present

## 2018-10-05 DIAGNOSIS — R7309 Other abnormal glucose: Secondary | ICD-10-CM | POA: Diagnosis not present

## 2019-02-28 DIAGNOSIS — M79671 Pain in right foot: Secondary | ICD-10-CM | POA: Diagnosis not present

## 2019-02-28 DIAGNOSIS — S93331A Other subluxation of right foot, initial encounter: Secondary | ICD-10-CM | POA: Diagnosis not present

## 2019-05-24 DIAGNOSIS — E6609 Other obesity due to excess calories: Secondary | ICD-10-CM | POA: Diagnosis not present

## 2019-05-24 DIAGNOSIS — Z6830 Body mass index (BMI) 30.0-30.9, adult: Secondary | ICD-10-CM | POA: Diagnosis not present

## 2019-05-24 DIAGNOSIS — I959 Hypotension, unspecified: Secondary | ICD-10-CM | POA: Diagnosis not present

## 2019-05-24 DIAGNOSIS — Z1389 Encounter for screening for other disorder: Secondary | ICD-10-CM | POA: Diagnosis not present

## 2019-05-24 DIAGNOSIS — R5383 Other fatigue: Secondary | ICD-10-CM | POA: Diagnosis not present

## 2019-06-04 DIAGNOSIS — G4730 Sleep apnea, unspecified: Secondary | ICD-10-CM | POA: Diagnosis not present

## 2019-06-19 DIAGNOSIS — R7309 Other abnormal glucose: Secondary | ICD-10-CM | POA: Diagnosis not present

## 2019-06-19 DIAGNOSIS — Z13220 Encounter for screening for lipoid disorders: Secondary | ICD-10-CM | POA: Diagnosis not present

## 2019-06-19 DIAGNOSIS — E6609 Other obesity due to excess calories: Secondary | ICD-10-CM | POA: Diagnosis not present

## 2019-06-19 DIAGNOSIS — E291 Testicular hypofunction: Secondary | ICD-10-CM | POA: Diagnosis not present

## 2019-06-19 DIAGNOSIS — Z6830 Body mass index (BMI) 30.0-30.9, adult: Secondary | ICD-10-CM | POA: Diagnosis not present

## 2019-06-26 DIAGNOSIS — G4733 Obstructive sleep apnea (adult) (pediatric): Secondary | ICD-10-CM | POA: Diagnosis not present

## 2019-07-25 DIAGNOSIS — G4733 Obstructive sleep apnea (adult) (pediatric): Secondary | ICD-10-CM | POA: Diagnosis not present

## 2019-08-13 DIAGNOSIS — R7309 Other abnormal glucose: Secondary | ICD-10-CM | POA: Diagnosis not present

## 2019-08-13 DIAGNOSIS — J449 Chronic obstructive pulmonary disease, unspecified: Secondary | ICD-10-CM | POA: Diagnosis not present

## 2019-08-13 DIAGNOSIS — G8921 Chronic pain due to trauma: Secondary | ICD-10-CM | POA: Diagnosis not present

## 2019-08-21 DIAGNOSIS — E291 Testicular hypofunction: Secondary | ICD-10-CM | POA: Diagnosis not present

## 2019-08-25 DIAGNOSIS — G4733 Obstructive sleep apnea (adult) (pediatric): Secondary | ICD-10-CM | POA: Diagnosis not present

## 2019-09-12 DIAGNOSIS — J449 Chronic obstructive pulmonary disease, unspecified: Secondary | ICD-10-CM | POA: Diagnosis not present

## 2019-09-12 DIAGNOSIS — G2581 Restless legs syndrome: Secondary | ICD-10-CM | POA: Diagnosis not present

## 2019-09-12 DIAGNOSIS — E782 Mixed hyperlipidemia: Secondary | ICD-10-CM | POA: Diagnosis not present

## 2019-09-24 DIAGNOSIS — G4733 Obstructive sleep apnea (adult) (pediatric): Secondary | ICD-10-CM | POA: Diagnosis not present

## 2019-10-11 DIAGNOSIS — R7309 Other abnormal glucose: Secondary | ICD-10-CM | POA: Diagnosis not present

## 2019-10-11 DIAGNOSIS — Z0001 Encounter for general adult medical examination with abnormal findings: Secondary | ICD-10-CM | POA: Diagnosis not present

## 2019-10-11 DIAGNOSIS — Z1389 Encounter for screening for other disorder: Secondary | ICD-10-CM | POA: Diagnosis not present

## 2019-10-11 DIAGNOSIS — E6609 Other obesity due to excess calories: Secondary | ICD-10-CM | POA: Diagnosis not present

## 2019-10-11 DIAGNOSIS — Z23 Encounter for immunization: Secondary | ICD-10-CM | POA: Diagnosis not present

## 2019-10-11 DIAGNOSIS — Z6831 Body mass index (BMI) 31.0-31.9, adult: Secondary | ICD-10-CM | POA: Diagnosis not present

## 2019-10-13 DIAGNOSIS — J4540 Moderate persistent asthma, uncomplicated: Secondary | ICD-10-CM | POA: Diagnosis not present

## 2019-10-13 DIAGNOSIS — E7849 Other hyperlipidemia: Secondary | ICD-10-CM | POA: Diagnosis not present

## 2019-10-25 ENCOUNTER — Other Ambulatory Visit (HOSPITAL_COMMUNITY): Payer: Self-pay | Admitting: *Deleted

## 2019-10-25 ENCOUNTER — Other Ambulatory Visit: Payer: Self-pay

## 2019-10-25 DIAGNOSIS — D696 Thrombocytopenia, unspecified: Secondary | ICD-10-CM

## 2019-10-25 DIAGNOSIS — G4733 Obstructive sleep apnea (adult) (pediatric): Secondary | ICD-10-CM | POA: Diagnosis not present

## 2019-10-26 ENCOUNTER — Inpatient Hospital Stay (HOSPITAL_COMMUNITY): Payer: Medicare HMO | Attending: Hematology

## 2019-10-26 ENCOUNTER — Encounter (HOSPITAL_COMMUNITY): Payer: Self-pay | Admitting: Hematology

## 2019-10-26 ENCOUNTER — Inpatient Hospital Stay (HOSPITAL_COMMUNITY): Payer: Medicare HMO | Admitting: Hematology

## 2019-10-26 DIAGNOSIS — F17210 Nicotine dependence, cigarettes, uncomplicated: Secondary | ICD-10-CM | POA: Diagnosis not present

## 2019-10-26 DIAGNOSIS — F1021 Alcohol dependence, in remission: Secondary | ICD-10-CM | POA: Diagnosis not present

## 2019-10-26 DIAGNOSIS — D696 Thrombocytopenia, unspecified: Secondary | ICD-10-CM | POA: Diagnosis not present

## 2019-10-26 DIAGNOSIS — R69 Illness, unspecified: Secondary | ICD-10-CM | POA: Diagnosis not present

## 2019-10-26 LAB — COMPREHENSIVE METABOLIC PANEL
ALT: 20 U/L (ref 0–44)
AST: 20 U/L (ref 15–41)
Albumin: 4.4 g/dL (ref 3.5–5.0)
Alkaline Phosphatase: 82 U/L (ref 38–126)
Anion gap: 10 (ref 5–15)
BUN: 17 mg/dL (ref 6–20)
CO2: 26 mmol/L (ref 22–32)
Calcium: 9.7 mg/dL (ref 8.9–10.3)
Chloride: 103 mmol/L (ref 98–111)
Creatinine, Ser: 1.12 mg/dL (ref 0.61–1.24)
GFR calc Af Amer: >60 mL/min (ref >60)
GFR calc non Af Amer: >60 mL/min (ref >60)
Glucose, Bld: 139 mg/dL — ABNORMAL HIGH (ref 70–99)
Potassium: 3.8 mmol/L (ref 3.5–5.1)
Sodium: 139 mmol/L (ref 135–145)
Total Bilirubin: 1.1 mg/dL (ref 0.3–1.2)
Total Protein: 7.2 g/dL (ref 6.5–8.1)

## 2019-10-26 LAB — CBC WITH DIFFERENTIAL/PLATELET
Abs Immature Granulocytes: 0.05 10*3/uL (ref 0.00–0.07)
Basophils Absolute: 0.1 10*3/uL (ref 0.0–0.1)
Basophils Relative: 1 %
Eosinophils Absolute: 0.2 10*3/uL (ref 0.0–0.5)
Eosinophils Relative: 3 %
HCT: 50.9 % (ref 39.0–52.0)
Hemoglobin: 17.1 g/dL — ABNORMAL HIGH (ref 13.0–17.0)
Immature Granulocytes: 1 %
Lymphocytes Relative: 25 %
Lymphs Abs: 1.7 10*3/uL (ref 0.7–4.0)
MCH: 31.4 pg (ref 26.0–34.0)
MCHC: 33.6 g/dL (ref 30.0–36.0)
MCV: 93.4 fL (ref 80.0–100.0)
Monocytes Absolute: 0.4 10*3/uL (ref 0.1–1.0)
Monocytes Relative: 5 %
Neutro Abs: 4.6 10*3/uL (ref 1.7–7.7)
Neutrophils Relative %: 65 %
Platelets: 96 10*3/uL — ABNORMAL LOW (ref 150–400)
RBC: 5.45 MIL/uL (ref 4.22–5.81)
RDW: 13.9 % (ref 11.5–15.5)
WBC: 7 10*3/uL (ref 4.0–10.5)
nRBC: 0 % (ref 0.0–0.2)

## 2019-10-26 NOTE — Progress Notes (Signed)
Mike Coleman, [place] [id number]   CLINIC:  Medical Oncology/Hematology  PCP:  Asencion Noble, MD [address] [place] [address] [phone]   REASON FOR VISIT:  Follow-up for Thrombocytopenia  CURRENT THERAPY: Clinical surveillance     INTERVAL HISTORY:   Mike Coleman 48 y.o. male presents today for follow-up.  He reports overall doing well.  He denies any significant fatigue denies any obvious signs of bleeding.  Denies any chest pain, shortness of breath, lightheadedness or dizziness.  Patient states his primary care provider recently did a blood check which showed platelet count of 80,000 and referred him back to hematology.    REVIEW OF SYSTEMS:  Review of Systems  Constitutional: Negative.   HENT:  Negative.   Eyes: Negative.   Respiratory: Negative.   Cardiovascular: Negative.   Gastrointestinal: Negative.   Endocrine: Negative.   Genitourinary: Negative.    Musculoskeletal: Negative.   Skin: Negative.   Neurological: Negative.   Hematological: Negative.   Psychiatric/Behavioral: Negative.      PAST MEDICAL/SURGICAL HISTORY:  Past Medical History:  Diagnosis Date  . Anxiety   . Chronic pain syndrome   . Closed fracture of acetabulum ([place])   . Closed fracture of metatarsal bone(s)   . Closed fracture of upper end of tibia   . Depression   . Traumatic arthropathy, lower leg    History reviewed. No pertinent surgical history.   SOCIAL HISTORY:  Social History   Socioeconomic History  . Marital status: Divorced    Spouse name: Not on file  . Number of children: Not on file  . Years of education: Not on file  . Highest education level: Not on file  Occupational History  . Not on file  Social Needs  . Financial resource strain: Not on file  . Food insecurity    Worry: Not on file    Inability: Not on file  . Transportation needs    Medical: Not on file    Non-medical: Not on file  Tobacco Use   . Smoking status: Current Every Day Smoker  . Smokeless tobacco: Never Used  Substance and Sexual Activity  . Alcohol use: No  . Drug use: No  . Sexual activity: Not on file    Comment: DIVORCED  Lifestyle  . Physical activity    Days per week: Not on file    Minutes per session: Not on file  . Stress: Not on file  Relationships  . Social Herbalist on phone: Not on file    Gets together: Not on file    Attends religious service: Not on file    Active member of club or organization: Not on file    Attends meetings of clubs or organizations: Not on file    Relationship status: Not on file  . Intimate partner violence    Fear of current or ex partner: Not on file    Emotionally abused: Not on file    Physically abused: Not on file    Forced sexual activity: Not on file  Other Topics Concern  . Not on file  Social History Narrative  . Not on file    FAMILY HISTORY:  History reviewed. No pertinent family history.  CURRENT MEDICATIONS:  Outpatient Encounter Medications as of 10/26/2019  Medication Sig  . Fluticasone-Salmeterol (ADVAIR) 250-50 MCG/DOSE AEPB Inhale 1-2 puffs into the lungs daily.   Marland Kitchen gabapentin (NEURONTIN) 300 MG capsule Take 600  mg by mouth at bedtime.  Marland Kitchen omeprazole (PRILOSEC) 40 MG capsule Take 40 mg by mouth daily.  Marland Kitchen rOPINIRole (REQUIP) 0.5 MG tablet Take 0.5-1 mg by mouth at bedtime.  . Testosterone 20.25 MG/ACT (1.62%) GEL SMARTSIG:20.25 Milligram(s) Topical Every Morning  . valACYclovir (VALTREX) 500 MG tablet Take 500 mg by mouth 2 (two) times daily as needed.  Marland Kitchen albuterol (PROVENTIL HFA;VENTOLIN HFA) 108 (90 BASE) MCG/ACT inhaler Inhale into the lungs daily as needed.  . fluticasone (FLONASE) 50 MCG/ACT nasal spray Place 2 sprays into the nose daily as needed. NASAL CONGESTION  . meloxicam (MOBIC) 15 MG tablet Take 15 mg by mouth daily.  . tadalafil (CIALIS) 20 MG tablet SMARTSIG:1 Tablet(s) By Mouth As Needed  . [DISCONTINUED] ranitidine  (ZANTAC) 150 MG tablet Take 150 mg by mouth 2 (two) times daily as needed.   No facility-administered encounter medications on file as of 10/26/2019.     ALLERGIES:  Allergies  Allergen Reactions  . Citalopram Hydrobromide     UNKNOWN ALLERGY     PHYSICAL EXAM:  ECOG Performance status: 1  Vitals:   10/26/19 1023  BP: 122/76  Pulse: 88  Resp: 18  Temp: 97.7 F (36.5 C)  SpO2: 93%   Filed Weights   10/26/19 1023  Weight: 208 lb (94.3 kg)    Physical Exam Constitutional:      Appearance: Normal appearance.  HENT:     Head: Normocephalic.     Right Ear: External ear normal.     Left Ear: External ear normal.     Nose: Nose normal.     Mouth/Throat:     Pharynx: Oropharynx is clear.  Eyes:     Conjunctiva/sclera: Conjunctivae normal.  Neck:     Musculoskeletal: Normal range of motion.  Cardiovascular:     Rate and Rhythm: Normal rate and regular rhythm.     Pulses: Normal pulses.     Heart sounds: Normal heart sounds.  Pulmonary:     Effort: Pulmonary effort is normal.     Breath sounds: Normal breath sounds.  Abdominal:     General: Bowel sounds are normal.  Musculoskeletal: Normal range of motion.  Skin:    General: Skin is warm.  Neurological:     Mental Status: He is alert. Mental status is at baseline.  Psychiatric:        Mood and Affect: Mood normal.        Behavior: Behavior normal.        Thought Content: Thought content normal.        Judgment: Judgment normal.      LABORATORY DATA:  I have reviewed the labs as listed.  CBC    Component Value Date/Time   WBC 7.0 10/26/2019 1016   RBC 5.45 10/26/2019 1016   HGB 17.1 (H) 10/26/2019 1016   HCT 50.9 10/26/2019 1016   PLT 96 (L) 10/26/2019 1016   MCV 93.4 10/26/2019 1016   MCH 31.4 10/26/2019 1016   MCHC 33.6 10/26/2019 1016   RDW 13.9 10/26/2019 1016   LYMPHSABS 1.7 10/26/2019 1016   MONOABS 0.4 10/26/2019 1016   EOSABS 0.2 10/26/2019 1016   BASOSABS 0.1 10/26/2019 1016   CMP  Latest Ref Rng & Units 10/26/2019 10/25/2016 03/09/2012  Glucose 70 - 99 mg/dL 139(H) 83 133(H)  BUN 6 - 20 mg/dL 17 17 6   Creatinine 0.61 - 1.24 mg/dL 1.12 1.11 0.65  Sodium 135 - 145 mmol/L 139 137 126(L)  Potassium 3.5 - 5.1 mmol/L  3.8 3.7 3.9  Chloride 98 - 111 mmol/L 103 104 83(L)  CO2 22 - 32 mmol/L 26 24 29   Calcium 8.9 - 10.3 mg/dL 9.7 9.3 9.3  Total Protein 6.5 - 8.1 g/dL 7.2 7.4 -  Total Bilirubin 0.3 - 1.2 mg/dL 1.1 0.7 -  Alkaline Phos 38 - 126 U/L 82 89 -  AST 15 - 41 U/L 20 24 -  ALT 0 - 44 U/L 20 28 -         ASSESSMENT & PLAN:   Thrombocytopenia (HCC) 1. Thrombocytopenia:  -  Most likely secondary to early liver disease and potential splenic sequestration.  - Patient has a long history of substance abuse.  Patient states he would drink beer all day long.  Narcotics.  Patient has been sober for the last 5 years. -Platelet count 3 years ago 2017 was 94,000.  Platelet count today is at 96,000.  Platelets remain relatively stable.  No interventions at this time.  Patient may follow-up yearly or sooner if needed.  2.  Tobacco abuse -Counseled the patient on the harmful effects of smoking and advised him to stop.  Cessation methods were discussed with the patient.          [facility] [phone]

## 2019-10-26 NOTE — Assessment & Plan Note (Signed)
1. Thrombocytopenia:  -  Most likely secondary to early liver disease and potential splenic sequestration.  - Patient has a long history of substance abuse.  Patient states he would drink beer all day long.  Narcotics.  Patient has been sober for the last 5 years. -Platelet count 3 years ago 2017 was 94,000.  Platelet count today is at 96,000.  Platelets remain relatively stable.  No interventions at this time.  Patient may follow-up yearly or sooner if needed.  2.  Tobacco abuse -Counseled the patient on the harmful effects of smoking and advised him to stop.  Cessation methods were discussed with the patient.

## 2019-11-12 DIAGNOSIS — I10 Essential (primary) hypertension: Secondary | ICD-10-CM | POA: Diagnosis not present

## 2019-11-12 DIAGNOSIS — J449 Chronic obstructive pulmonary disease, unspecified: Secondary | ICD-10-CM | POA: Diagnosis not present

## 2019-11-21 DIAGNOSIS — I159 Secondary hypertension, unspecified: Secondary | ICD-10-CM | POA: Diagnosis not present

## 2019-11-24 DIAGNOSIS — G4733 Obstructive sleep apnea (adult) (pediatric): Secondary | ICD-10-CM | POA: Diagnosis not present

## 2019-12-13 DIAGNOSIS — J4540 Moderate persistent asthma, uncomplicated: Secondary | ICD-10-CM | POA: Diagnosis not present

## 2019-12-13 DIAGNOSIS — R69 Illness, unspecified: Secondary | ICD-10-CM | POA: Diagnosis not present

## 2019-12-25 DIAGNOSIS — I10 Essential (primary) hypertension: Secondary | ICD-10-CM | POA: Diagnosis not present

## 2019-12-25 DIAGNOSIS — G4733 Obstructive sleep apnea (adult) (pediatric): Secondary | ICD-10-CM | POA: Diagnosis not present

## 2020-01-11 DIAGNOSIS — M25562 Pain in left knee: Secondary | ICD-10-CM | POA: Diagnosis not present

## 2020-01-13 DIAGNOSIS — E291 Testicular hypofunction: Secondary | ICD-10-CM | POA: Diagnosis not present

## 2020-01-13 DIAGNOSIS — J4540 Moderate persistent asthma, uncomplicated: Secondary | ICD-10-CM | POA: Diagnosis not present

## 2020-01-13 DIAGNOSIS — R69 Illness, unspecified: Secondary | ICD-10-CM | POA: Diagnosis not present

## 2020-01-25 DIAGNOSIS — G4733 Obstructive sleep apnea (adult) (pediatric): Secondary | ICD-10-CM | POA: Diagnosis not present

## 2020-02-10 DIAGNOSIS — E291 Testicular hypofunction: Secondary | ICD-10-CM | POA: Diagnosis not present

## 2020-02-10 DIAGNOSIS — R69 Illness, unspecified: Secondary | ICD-10-CM | POA: Diagnosis not present

## 2020-02-10 DIAGNOSIS — J4540 Moderate persistent asthma, uncomplicated: Secondary | ICD-10-CM | POA: Diagnosis not present

## 2020-02-22 DIAGNOSIS — M1712 Unilateral primary osteoarthritis, left knee: Secondary | ICD-10-CM | POA: Diagnosis not present

## 2020-02-22 DIAGNOSIS — G4733 Obstructive sleep apnea (adult) (pediatric): Secondary | ICD-10-CM | POA: Diagnosis not present

## 2020-03-04 DIAGNOSIS — R69 Illness, unspecified: Secondary | ICD-10-CM | POA: Diagnosis not present

## 2020-03-12 DIAGNOSIS — E291 Testicular hypofunction: Secondary | ICD-10-CM | POA: Diagnosis not present

## 2020-03-12 DIAGNOSIS — R69 Illness, unspecified: Secondary | ICD-10-CM | POA: Diagnosis not present

## 2020-03-12 DIAGNOSIS — Z720 Tobacco use: Secondary | ICD-10-CM | POA: Diagnosis not present

## 2020-03-12 DIAGNOSIS — J4540 Moderate persistent asthma, uncomplicated: Secondary | ICD-10-CM | POA: Diagnosis not present

## 2020-03-24 DIAGNOSIS — G4733 Obstructive sleep apnea (adult) (pediatric): Secondary | ICD-10-CM | POA: Diagnosis not present

## 2020-03-27 DIAGNOSIS — I10 Essential (primary) hypertension: Secondary | ICD-10-CM | POA: Diagnosis not present

## 2020-03-27 DIAGNOSIS — Z1389 Encounter for screening for other disorder: Secondary | ICD-10-CM | POA: Diagnosis not present

## 2020-03-27 DIAGNOSIS — E6609 Other obesity due to excess calories: Secondary | ICD-10-CM | POA: Diagnosis not present

## 2020-03-27 DIAGNOSIS — Z6831 Body mass index (BMI) 31.0-31.9, adult: Secondary | ICD-10-CM | POA: Diagnosis not present

## 2020-04-01 DIAGNOSIS — R69 Illness, unspecified: Secondary | ICD-10-CM | POA: Diagnosis not present

## 2020-04-03 ENCOUNTER — Ambulatory Visit: Payer: Medicare HMO | Attending: Internal Medicine

## 2020-04-03 DIAGNOSIS — Z23 Encounter for immunization: Secondary | ICD-10-CM

## 2020-04-03 NOTE — Progress Notes (Signed)
   Covid-19 Vaccination Clinic  Name:  Mike Coleman    MRN: [id number] DOB: 02/25/1971  04/03/2020  [name] was observed post Covid-19 immunization for 15 minutes without incident. He was provided with Vaccine Information Sheet and instruction to access the V-Safe system.   [name] was instructed to call 911 with any severe reactions post vaccine: Marland Kitchen Difficulty breathing  . Swelling of face and throat  . A fast heartbeat  . A bad rash all over body  . Dizziness and weakness   Immunizations Administered    Name Date Dose VIS Date Route   Moderna COVID-19 Vaccine 04/03/2020 12:20 PM 0.5 mL 11/2019 Intramuscular   Manufacturer: Moderna   Lot: [id number]   [id number]

## 2020-04-04 DIAGNOSIS — M1712 Unilateral primary osteoarthritis, left knee: Secondary | ICD-10-CM | POA: Diagnosis not present

## 2020-04-11 DIAGNOSIS — E291 Testicular hypofunction: Secondary | ICD-10-CM | POA: Diagnosis not present

## 2020-04-11 DIAGNOSIS — J4540 Moderate persistent asthma, uncomplicated: Secondary | ICD-10-CM | POA: Diagnosis not present

## 2020-04-11 DIAGNOSIS — M1712 Unilateral primary osteoarthritis, left knee: Secondary | ICD-10-CM | POA: Diagnosis not present

## 2020-04-11 DIAGNOSIS — Z720 Tobacco use: Secondary | ICD-10-CM | POA: Diagnosis not present

## 2020-04-11 DIAGNOSIS — R69 Illness, unspecified: Secondary | ICD-10-CM | POA: Diagnosis not present

## 2020-04-18 DIAGNOSIS — M1712 Unilateral primary osteoarthritis, left knee: Secondary | ICD-10-CM | POA: Diagnosis not present

## 2020-04-23 DIAGNOSIS — G4733 Obstructive sleep apnea (adult) (pediatric): Secondary | ICD-10-CM | POA: Diagnosis not present

## 2020-05-06 ENCOUNTER — Ambulatory Visit: Payer: Medicare HMO | Attending: Internal Medicine

## 2020-05-06 DIAGNOSIS — Z23 Encounter for immunization: Secondary | ICD-10-CM

## 2020-05-06 NOTE — Progress Notes (Signed)
   Covid-19 Vaccination Clinic  Name:  FILIBERTO KISTER    MRN: [id number] DOB: 29-May-1971  05/06/2020  Mr. Robin was observed post Covid-19 immunization for 15 minutes without incident. He was provided with Vaccine Information Sheet and instruction to access the V-Safe system.   Mr. Chalupa was instructed to call 911 with any severe reactions post vaccine: Marland Kitchen Difficulty breathing  . Swelling of face and throat  . A fast heartbeat  . A bad rash all over body  . Dizziness and weakness   Immunizations Administered    Name Date Dose VIS Date Route   Moderna COVID-19 Vaccine 05/06/2020 12:17 PM 0.5 mL 11/2019 Intramuscular   Manufacturer: Moderna   Lot: [id number]   [id number]

## 2020-05-12 DIAGNOSIS — J4540 Moderate persistent asthma, uncomplicated: Secondary | ICD-10-CM | POA: Diagnosis not present

## 2020-05-12 DIAGNOSIS — Z720 Tobacco use: Secondary | ICD-10-CM | POA: Diagnosis not present

## 2020-05-12 DIAGNOSIS — E291 Testicular hypofunction: Secondary | ICD-10-CM | POA: Diagnosis not present

## 2020-05-12 DIAGNOSIS — R69 Illness, unspecified: Secondary | ICD-10-CM | POA: Diagnosis not present

## 2020-06-11 DIAGNOSIS — R69 Illness, unspecified: Secondary | ICD-10-CM | POA: Diagnosis not present

## 2020-06-11 DIAGNOSIS — J4540 Moderate persistent asthma, uncomplicated: Secondary | ICD-10-CM | POA: Diagnosis not present

## 2020-06-11 DIAGNOSIS — E291 Testicular hypofunction: Secondary | ICD-10-CM | POA: Diagnosis not present

## 2020-06-11 DIAGNOSIS — Z720 Tobacco use: Secondary | ICD-10-CM | POA: Diagnosis not present

## 2020-07-11 DIAGNOSIS — J4540 Moderate persistent asthma, uncomplicated: Secondary | ICD-10-CM | POA: Diagnosis not present

## 2020-07-11 DIAGNOSIS — Z720 Tobacco use: Secondary | ICD-10-CM | POA: Diagnosis not present

## 2020-07-11 DIAGNOSIS — E291 Testicular hypofunction: Secondary | ICD-10-CM | POA: Diagnosis not present

## 2020-07-11 DIAGNOSIS — R69 Illness, unspecified: Secondary | ICD-10-CM | POA: Diagnosis not present

## 2020-08-12 DIAGNOSIS — J4540 Moderate persistent asthma, uncomplicated: Secondary | ICD-10-CM | POA: Diagnosis not present

## 2020-08-12 DIAGNOSIS — Z720 Tobacco use: Secondary | ICD-10-CM | POA: Diagnosis not present

## 2020-08-12 DIAGNOSIS — R69 Illness, unspecified: Secondary | ICD-10-CM | POA: Diagnosis not present

## 2020-08-12 DIAGNOSIS — E291 Testicular hypofunction: Secondary | ICD-10-CM | POA: Diagnosis not present

## 2020-09-11 DIAGNOSIS — E291 Testicular hypofunction: Secondary | ICD-10-CM | POA: Diagnosis not present

## 2020-09-11 DIAGNOSIS — Z720 Tobacco use: Secondary | ICD-10-CM | POA: Diagnosis not present

## 2020-09-11 DIAGNOSIS — R69 Illness, unspecified: Secondary | ICD-10-CM | POA: Diagnosis not present

## 2020-09-11 DIAGNOSIS — J4540 Moderate persistent asthma, uncomplicated: Secondary | ICD-10-CM | POA: Diagnosis not present

## 2020-10-11 DIAGNOSIS — R69 Illness, unspecified: Secondary | ICD-10-CM | POA: Diagnosis not present

## 2020-10-11 DIAGNOSIS — Z720 Tobacco use: Secondary | ICD-10-CM | POA: Diagnosis not present

## 2020-10-11 DIAGNOSIS — J4550 Severe persistent asthma, uncomplicated: Secondary | ICD-10-CM | POA: Diagnosis not present

## 2020-10-11 DIAGNOSIS — E291 Testicular hypofunction: Secondary | ICD-10-CM | POA: Diagnosis not present

## 2020-10-17 DIAGNOSIS — G4730 Sleep apnea, unspecified: Secondary | ICD-10-CM | POA: Diagnosis not present

## 2020-10-17 DIAGNOSIS — Z23 Encounter for immunization: Secondary | ICD-10-CM | POA: Diagnosis not present

## 2020-10-17 DIAGNOSIS — Z1331 Encounter for screening for depression: Secondary | ICD-10-CM | POA: Diagnosis not present

## 2020-10-17 DIAGNOSIS — Z6832 Body mass index (BMI) 32.0-32.9, adult: Secondary | ICD-10-CM | POA: Diagnosis not present

## 2020-10-17 DIAGNOSIS — R0989 Other specified symptoms and signs involving the circulatory and respiratory systems: Secondary | ICD-10-CM | POA: Diagnosis not present

## 2020-10-17 DIAGNOSIS — I10 Essential (primary) hypertension: Secondary | ICD-10-CM | POA: Diagnosis not present

## 2020-10-17 DIAGNOSIS — Z0000 Encounter for general adult medical examination without abnormal findings: Secondary | ICD-10-CM | POA: Diagnosis not present

## 2020-10-20 ENCOUNTER — Other Ambulatory Visit (HOSPITAL_COMMUNITY): Payer: Self-pay | Admitting: Surgery

## 2020-10-20 DIAGNOSIS — D696 Thrombocytopenia, unspecified: Secondary | ICD-10-CM

## 2020-10-21 ENCOUNTER — Inpatient Hospital Stay (HOSPITAL_COMMUNITY): Payer: Medicare HMO | Attending: Hematology

## 2020-10-27 ENCOUNTER — Inpatient Hospital Stay (HOSPITAL_COMMUNITY): Payer: Medicare HMO | Admitting: Oncology

## 2020-10-28 ENCOUNTER — Ambulatory Visit (HOSPITAL_COMMUNITY): Payer: Medicare HMO | Admitting: Nurse Practitioner

## 2020-11-11 DIAGNOSIS — J449 Chronic obstructive pulmonary disease, unspecified: Secondary | ICD-10-CM | POA: Diagnosis not present

## 2020-11-11 DIAGNOSIS — Z720 Tobacco use: Secondary | ICD-10-CM | POA: Diagnosis not present

## 2020-11-11 DIAGNOSIS — J4540 Moderate persistent asthma, uncomplicated: Secondary | ICD-10-CM | POA: Diagnosis not present

## 2020-11-11 DIAGNOSIS — I10 Essential (primary) hypertension: Secondary | ICD-10-CM | POA: Diagnosis not present

## 2020-11-12 DIAGNOSIS — R69 Illness, unspecified: Secondary | ICD-10-CM | POA: Diagnosis not present

## 2020-12-12 DIAGNOSIS — J4540 Moderate persistent asthma, uncomplicated: Secondary | ICD-10-CM | POA: Diagnosis not present

## 2020-12-12 DIAGNOSIS — Z720 Tobacco use: Secondary | ICD-10-CM | POA: Diagnosis not present

## 2021-01-10 DIAGNOSIS — Z720 Tobacco use: Secondary | ICD-10-CM | POA: Diagnosis not present

## 2021-01-10 DIAGNOSIS — J4540 Moderate persistent asthma, uncomplicated: Secondary | ICD-10-CM | POA: Diagnosis not present

## 2021-02-09 DIAGNOSIS — J4540 Moderate persistent asthma, uncomplicated: Secondary | ICD-10-CM | POA: Diagnosis not present

## 2021-02-09 DIAGNOSIS — Z720 Tobacco use: Secondary | ICD-10-CM | POA: Diagnosis not present

## 2021-03-11 DIAGNOSIS — I10 Essential (primary) hypertension: Secondary | ICD-10-CM | POA: Diagnosis not present

## 2021-03-11 DIAGNOSIS — J4540 Moderate persistent asthma, uncomplicated: Secondary | ICD-10-CM | POA: Diagnosis not present

## 2021-04-11 DIAGNOSIS — I10 Essential (primary) hypertension: Secondary | ICD-10-CM | POA: Diagnosis not present

## 2021-04-11 DIAGNOSIS — J4540 Moderate persistent asthma, uncomplicated: Secondary | ICD-10-CM | POA: Diagnosis not present

## 2021-05-12 DIAGNOSIS — I10 Essential (primary) hypertension: Secondary | ICD-10-CM | POA: Diagnosis not present

## 2021-05-12 DIAGNOSIS — J4540 Moderate persistent asthma, uncomplicated: Secondary | ICD-10-CM | POA: Diagnosis not present

## 2021-06-11 DIAGNOSIS — I10 Essential (primary) hypertension: Secondary | ICD-10-CM | POA: Diagnosis not present

## 2021-06-11 DIAGNOSIS — J4540 Moderate persistent asthma, uncomplicated: Secondary | ICD-10-CM | POA: Diagnosis not present

## 2021-07-01 DIAGNOSIS — I10 Essential (primary) hypertension: Secondary | ICD-10-CM | POA: Diagnosis not present

## 2021-07-01 DIAGNOSIS — E782 Mixed hyperlipidemia: Secondary | ICD-10-CM | POA: Diagnosis not present

## 2021-07-01 DIAGNOSIS — G2581 Restless legs syndrome: Secondary | ICD-10-CM | POA: Diagnosis not present

## 2021-07-12 DIAGNOSIS — J4540 Moderate persistent asthma, uncomplicated: Secondary | ICD-10-CM | POA: Diagnosis not present

## 2021-07-12 DIAGNOSIS — I10 Essential (primary) hypertension: Secondary | ICD-10-CM | POA: Diagnosis not present

## 2021-08-12 DIAGNOSIS — J4540 Moderate persistent asthma, uncomplicated: Secondary | ICD-10-CM | POA: Diagnosis not present

## 2021-08-12 DIAGNOSIS — I10 Essential (primary) hypertension: Secondary | ICD-10-CM | POA: Diagnosis not present

## 2021-09-11 DIAGNOSIS — I10 Essential (primary) hypertension: Secondary | ICD-10-CM | POA: Diagnosis not present

## 2021-09-11 DIAGNOSIS — J4540 Moderate persistent asthma, uncomplicated: Secondary | ICD-10-CM | POA: Diagnosis not present

## 2021-10-12 DIAGNOSIS — I10 Essential (primary) hypertension: Secondary | ICD-10-CM | POA: Diagnosis not present

## 2021-10-12 DIAGNOSIS — J4540 Moderate persistent asthma, uncomplicated: Secondary | ICD-10-CM | POA: Diagnosis not present

## 2021-10-30 DIAGNOSIS — Z0001 Encounter for general adult medical examination with abnormal findings: Secondary | ICD-10-CM | POA: Diagnosis not present

## 2021-10-30 DIAGNOSIS — G8921 Chronic pain due to trauma: Secondary | ICD-10-CM | POA: Diagnosis not present

## 2021-10-30 DIAGNOSIS — G2581 Restless legs syndrome: Secondary | ICD-10-CM | POA: Diagnosis not present

## 2021-10-30 DIAGNOSIS — E291 Testicular hypofunction: Secondary | ICD-10-CM | POA: Diagnosis not present

## 2021-10-30 DIAGNOSIS — Z6831 Body mass index (BMI) 31.0-31.9, adult: Secondary | ICD-10-CM | POA: Diagnosis not present

## 2021-10-30 DIAGNOSIS — Z1331 Encounter for screening for depression: Secondary | ICD-10-CM | POA: Diagnosis not present

## 2021-10-30 DIAGNOSIS — E782 Mixed hyperlipidemia: Secondary | ICD-10-CM | POA: Diagnosis not present

## 2021-10-30 DIAGNOSIS — I10 Essential (primary) hypertension: Secondary | ICD-10-CM | POA: Diagnosis not present

## 2021-10-30 DIAGNOSIS — E6609 Other obesity due to excess calories: Secondary | ICD-10-CM | POA: Diagnosis not present

## 2021-10-30 DIAGNOSIS — R7309 Other abnormal glucose: Secondary | ICD-10-CM | POA: Diagnosis not present

## 2021-10-30 DIAGNOSIS — Z23 Encounter for immunization: Secondary | ICD-10-CM | POA: Diagnosis not present

## 2021-11-11 DIAGNOSIS — J4540 Moderate persistent asthma, uncomplicated: Secondary | ICD-10-CM | POA: Diagnosis not present

## 2021-11-11 DIAGNOSIS — I10 Essential (primary) hypertension: Secondary | ICD-10-CM | POA: Diagnosis not present

## 2021-12-11 DIAGNOSIS — J4540 Moderate persistent asthma, uncomplicated: Secondary | ICD-10-CM | POA: Diagnosis not present

## 2021-12-11 DIAGNOSIS — I10 Essential (primary) hypertension: Secondary | ICD-10-CM | POA: Diagnosis not present

## 2022-01-12 DIAGNOSIS — I10 Essential (primary) hypertension: Secondary | ICD-10-CM | POA: Diagnosis not present

## 2022-01-12 DIAGNOSIS — J4540 Moderate persistent asthma, uncomplicated: Secondary | ICD-10-CM | POA: Diagnosis not present

## 2022-02-09 DIAGNOSIS — I10 Essential (primary) hypertension: Secondary | ICD-10-CM | POA: Diagnosis not present

## 2022-02-09 DIAGNOSIS — J4540 Moderate persistent asthma, uncomplicated: Secondary | ICD-10-CM | POA: Diagnosis not present

## 2022-03-12 DIAGNOSIS — E782 Mixed hyperlipidemia: Secondary | ICD-10-CM | POA: Diagnosis not present

## 2022-03-12 DIAGNOSIS — I10 Essential (primary) hypertension: Secondary | ICD-10-CM | POA: Diagnosis not present

## 2022-03-12 DIAGNOSIS — J449 Chronic obstructive pulmonary disease, unspecified: Secondary | ICD-10-CM | POA: Diagnosis not present

## 2022-04-11 DIAGNOSIS — J449 Chronic obstructive pulmonary disease, unspecified: Secondary | ICD-10-CM | POA: Diagnosis not present

## 2022-04-11 DIAGNOSIS — E782 Mixed hyperlipidemia: Secondary | ICD-10-CM | POA: Diagnosis not present

## 2022-04-11 DIAGNOSIS — I10 Essential (primary) hypertension: Secondary | ICD-10-CM | POA: Diagnosis not present

## 2022-05-12 DIAGNOSIS — I10 Essential (primary) hypertension: Secondary | ICD-10-CM | POA: Diagnosis not present

## 2022-05-12 DIAGNOSIS — J449 Chronic obstructive pulmonary disease, unspecified: Secondary | ICD-10-CM | POA: Diagnosis not present

## 2022-05-12 DIAGNOSIS — E782 Mixed hyperlipidemia: Secondary | ICD-10-CM | POA: Diagnosis not present

## 2022-07-23 DIAGNOSIS — R7309 Other abnormal glucose: Secondary | ICD-10-CM | POA: Diagnosis not present

## 2022-07-23 DIAGNOSIS — G8921 Chronic pain due to trauma: Secondary | ICD-10-CM | POA: Diagnosis not present

## 2022-07-23 DIAGNOSIS — R69 Illness, unspecified: Secondary | ICD-10-CM | POA: Diagnosis not present

## 2022-07-23 DIAGNOSIS — E291 Testicular hypofunction: Secondary | ICD-10-CM | POA: Diagnosis not present

## 2022-08-12 DIAGNOSIS — E782 Mixed hyperlipidemia: Secondary | ICD-10-CM | POA: Diagnosis not present

## 2022-08-12 DIAGNOSIS — J449 Chronic obstructive pulmonary disease, unspecified: Secondary | ICD-10-CM | POA: Diagnosis not present

## 2022-08-12 DIAGNOSIS — I10 Essential (primary) hypertension: Secondary | ICD-10-CM | POA: Diagnosis not present

## 2022-09-11 DIAGNOSIS — E782 Mixed hyperlipidemia: Secondary | ICD-10-CM | POA: Diagnosis not present

## 2022-09-11 DIAGNOSIS — J449 Chronic obstructive pulmonary disease, unspecified: Secondary | ICD-10-CM | POA: Diagnosis not present

## 2022-09-11 DIAGNOSIS — I10 Essential (primary) hypertension: Secondary | ICD-10-CM | POA: Diagnosis not present

## 2022-10-12 DIAGNOSIS — J449 Chronic obstructive pulmonary disease, unspecified: Secondary | ICD-10-CM | POA: Diagnosis not present

## 2022-10-12 DIAGNOSIS — I10 Essential (primary) hypertension: Secondary | ICD-10-CM | POA: Diagnosis not present

## 2022-10-12 DIAGNOSIS — E782 Mixed hyperlipidemia: Secondary | ICD-10-CM | POA: Diagnosis not present

## 2022-11-11 DIAGNOSIS — I10 Essential (primary) hypertension: Secondary | ICD-10-CM | POA: Diagnosis not present

## 2022-11-11 DIAGNOSIS — J449 Chronic obstructive pulmonary disease, unspecified: Secondary | ICD-10-CM | POA: Diagnosis not present

## 2022-11-11 DIAGNOSIS — E782 Mixed hyperlipidemia: Secondary | ICD-10-CM | POA: Diagnosis not present

## 2023-01-09 ENCOUNTER — Ambulatory Visit: Payer: Self-pay

## 2023-03-13 DIAGNOSIS — J449 Chronic obstructive pulmonary disease, unspecified: Secondary | ICD-10-CM | POA: Diagnosis not present

## 2023-03-13 DIAGNOSIS — I10 Essential (primary) hypertension: Secondary | ICD-10-CM | POA: Diagnosis not present

## 2023-03-13 DIAGNOSIS — E782 Mixed hyperlipidemia: Secondary | ICD-10-CM | POA: Diagnosis not present

## 2023-05-11 DIAGNOSIS — H00012 Hordeolum externum right lower eyelid: Secondary | ICD-10-CM | POA: Diagnosis not present

## 2023-05-11 DIAGNOSIS — H25093 Other age-related incipient cataract, bilateral: Secondary | ICD-10-CM | POA: Diagnosis not present

## 2023-05-13 DIAGNOSIS — E782 Mixed hyperlipidemia: Secondary | ICD-10-CM | POA: Diagnosis not present

## 2023-05-13 DIAGNOSIS — I10 Essential (primary) hypertension: Secondary | ICD-10-CM | POA: Diagnosis not present

## 2023-05-13 DIAGNOSIS — J449 Chronic obstructive pulmonary disease, unspecified: Secondary | ICD-10-CM | POA: Diagnosis not present

## 2023-06-09 DIAGNOSIS — Z6832 Body mass index (BMI) 32.0-32.9, adult: Secondary | ICD-10-CM | POA: Diagnosis not present

## 2023-06-09 DIAGNOSIS — G4733 Obstructive sleep apnea (adult) (pediatric): Secondary | ICD-10-CM | POA: Diagnosis not present

## 2023-06-09 DIAGNOSIS — R7309 Other abnormal glucose: Secondary | ICD-10-CM | POA: Diagnosis not present

## 2023-06-09 DIAGNOSIS — E6609 Other obesity due to excess calories: Secondary | ICD-10-CM | POA: Diagnosis not present

## 2023-06-09 DIAGNOSIS — R400 Somnolence: Secondary | ICD-10-CM | POA: Diagnosis not present

## 2023-06-09 DIAGNOSIS — E291 Testicular hypofunction: Secondary | ICD-10-CM | POA: Diagnosis not present

## 2023-06-09 DIAGNOSIS — I10 Essential (primary) hypertension: Secondary | ICD-10-CM | POA: Diagnosis not present

## 2023-06-28 DIAGNOSIS — E291 Testicular hypofunction: Secondary | ICD-10-CM | POA: Diagnosis not present

## 2023-06-28 DIAGNOSIS — R7309 Other abnormal glucose: Secondary | ICD-10-CM | POA: Diagnosis not present

## 2023-06-28 DIAGNOSIS — F17290 Nicotine dependence, other tobacco product, uncomplicated: Secondary | ICD-10-CM | POA: Diagnosis not present

## 2024-06-29 DIAGNOSIS — Z6833 Body mass index (BMI) 33.0-33.9, adult: Secondary | ICD-10-CM | POA: Diagnosis not present

## 2024-06-29 DIAGNOSIS — E782 Mixed hyperlipidemia: Secondary | ICD-10-CM | POA: Diagnosis not present

## 2024-06-29 DIAGNOSIS — I10 Essential (primary) hypertension: Secondary | ICD-10-CM | POA: Diagnosis not present

## 2024-06-29 DIAGNOSIS — Z1211 Encounter for screening for malignant neoplasm of colon: Secondary | ICD-10-CM | POA: Diagnosis not present

## 2024-06-29 DIAGNOSIS — Z7689 Persons encountering health services in other specified circumstances: Secondary | ICD-10-CM | POA: Diagnosis not present

## 2024-06-29 DIAGNOSIS — Z87898 Personal history of other specified conditions: Secondary | ICD-10-CM | POA: Diagnosis not present

## 2024-06-29 DIAGNOSIS — F17200 Nicotine dependence, unspecified, uncomplicated: Secondary | ICD-10-CM | POA: Diagnosis not present

## 2024-07-20 DIAGNOSIS — R7989 Other specified abnormal findings of blood chemistry: Secondary | ICD-10-CM | POA: Diagnosis not present

## 2024-07-26 ENCOUNTER — Other Ambulatory Visit (HOSPITAL_COMMUNITY): Payer: Self-pay

## 2024-07-26 DIAGNOSIS — F17200 Nicotine dependence, unspecified, uncomplicated: Secondary | ICD-10-CM | POA: Diagnosis not present

## 2024-07-26 DIAGNOSIS — R7989 Other specified abnormal findings of blood chemistry: Secondary | ICD-10-CM | POA: Diagnosis not present

## 2024-07-26 DIAGNOSIS — Z6833 Body mass index (BMI) 33.0-33.9, adult: Secondary | ICD-10-CM | POA: Diagnosis not present

## 2024-07-26 DIAGNOSIS — E291 Testicular hypofunction: Secondary | ICD-10-CM | POA: Diagnosis not present

## 2024-07-26 DIAGNOSIS — E1165 Type 2 diabetes mellitus with hyperglycemia: Secondary | ICD-10-CM | POA: Diagnosis not present

## 2024-07-26 DIAGNOSIS — Z87898 Personal history of other specified conditions: Secondary | ICD-10-CM | POA: Diagnosis not present

## 2024-07-26 DIAGNOSIS — E782 Mixed hyperlipidemia: Secondary | ICD-10-CM | POA: Diagnosis not present

## 2024-07-26 DIAGNOSIS — I10 Essential (primary) hypertension: Secondary | ICD-10-CM | POA: Diagnosis not present

## 2024-07-26 DIAGNOSIS — Z0001 Encounter for general adult medical examination with abnormal findings: Secondary | ICD-10-CM | POA: Diagnosis not present

## 2024-08-12 ENCOUNTER — Ambulatory Visit (HOSPITAL_COMMUNITY)

## 2024-09-11 ENCOUNTER — Encounter (HOSPITAL_COMMUNITY): Payer: Self-pay

## 2024-09-11 ENCOUNTER — Ambulatory Visit (HOSPITAL_COMMUNITY): Admission: RE | Admit: 2024-09-11 | Source: Ambulatory Visit

## 2024-11-16 DIAGNOSIS — Z125 Encounter for screening for malignant neoplasm of prostate: Secondary | ICD-10-CM | POA: Diagnosis not present

## 2024-11-16 DIAGNOSIS — Z87898 Personal history of other specified conditions: Secondary | ICD-10-CM | POA: Diagnosis not present

## 2024-11-16 DIAGNOSIS — E1165 Type 2 diabetes mellitus with hyperglycemia: Secondary | ICD-10-CM | POA: Diagnosis not present

## 2024-11-16 DIAGNOSIS — D696 Thrombocytopenia, unspecified: Secondary | ICD-10-CM | POA: Diagnosis not present

## 2024-11-16 DIAGNOSIS — I10 Essential (primary) hypertension: Secondary | ICD-10-CM | POA: Diagnosis not present

## 2024-11-16 DIAGNOSIS — E291 Testicular hypofunction: Secondary | ICD-10-CM | POA: Diagnosis not present

## 2024-11-16 DIAGNOSIS — J302 Other seasonal allergic rhinitis: Secondary | ICD-10-CM | POA: Diagnosis not present

## 2025-01-04 ENCOUNTER — Inpatient Hospital Stay: Attending: Hematology | Admitting: Hematology

## 2025-01-04 ENCOUNTER — Inpatient Hospital Stay

## 2025-01-04 ENCOUNTER — Encounter: Payer: Self-pay | Admitting: Hematology

## 2025-01-04 VITALS — BP 100/60 | HR 73 | Temp 98.9°F | Resp 18 | Ht 66.0 in | Wt 213.0 lb

## 2025-01-04 DIAGNOSIS — R5383 Other fatigue: Secondary | ICD-10-CM | POA: Insufficient documentation

## 2025-01-04 DIAGNOSIS — F17210 Nicotine dependence, cigarettes, uncomplicated: Secondary | ICD-10-CM | POA: Insufficient documentation

## 2025-01-04 DIAGNOSIS — D696 Thrombocytopenia, unspecified: Secondary | ICD-10-CM

## 2025-01-04 LAB — CBC WITH DIFFERENTIAL/PLATELET
Abs Immature Granulocytes: 0.05 K/uL (ref 0.00–0.07)
Basophils Absolute: 0.1 K/uL (ref 0.0–0.1)
Basophils Relative: 2 %
Eosinophils Absolute: 0.3 K/uL (ref 0.0–0.5)
Eosinophils Relative: 4 %
HCT: 50.4 % (ref 39.0–52.0)
Hemoglobin: 17 g/dL (ref 13.0–17.0)
Immature Granulocytes: 1 %
Lymphocytes Relative: 25 %
Lymphs Abs: 2 K/uL (ref 0.7–4.0)
MCH: 30.5 pg (ref 26.0–34.0)
MCHC: 33.7 g/dL (ref 30.0–36.0)
MCV: 90.5 fL (ref 80.0–100.0)
Monocytes Absolute: 0.4 K/uL (ref 0.1–1.0)
Monocytes Relative: 5 %
Neutro Abs: 5 K/uL (ref 1.7–7.7)
Neutrophils Relative %: 63 %
Platelets: 99 K/uL — ABNORMAL LOW (ref 150–400)
RBC: 5.57 MIL/uL (ref 4.22–5.81)
RDW: 13.4 % (ref 11.5–15.5)
WBC: 7.8 K/uL (ref 4.0–10.5)
nRBC: 0 % (ref 0.0–0.2)

## 2025-01-04 LAB — FERRITIN: Ferritin: 229 ng/mL (ref 24–336)

## 2025-01-04 LAB — HEPATITIS PANEL, ACUTE
HCV Ab: NONREACTIVE
Hep A IgM: NONREACTIVE
Hep B C IgM: NONREACTIVE
Hepatitis B Surface Ag: NONREACTIVE

## 2025-01-04 LAB — FOLATE: Folate: 5.5 ng/mL — ABNORMAL LOW

## 2025-01-04 LAB — HIV ANTIBODY (ROUTINE TESTING W REFLEX): HIV Screen 4th Generation wRfx: NONREACTIVE

## 2025-01-04 LAB — IMMATURE PLATELET FRACTION: Immature Platelet Fraction: 33.4 % — ABNORMAL HIGH (ref 1.2–8.6)

## 2025-01-04 LAB — VITAMIN B12: Vitamin B-12: 570 pg/mL (ref 180–914)

## 2025-01-04 NOTE — Progress Notes (Signed)
 " [facility]   [phone] Fax:([phone]   Clinic New Consult Note   Patient Care Team: Sheryle Carwin, MD as PCP - General (Internal Medicine) 01/04/2025  CHIEF COMPLAINTS/PURPOSE OF CONSULTATION:  Thrombocytopenia  REFERRING PHYSICIAN: Gladis Lauraine BRAVO, NP   Discussed the use of AI scribe software for clinical note transcription with the patient, who gave verbal consent to proceed.  History of Present Illness Mike Coleman is a 54 year old male with chronic thrombocytopenia who presents for evaluation of persistently low platelet counts and fatigue.  He has had low platelet counts for about 20 years, with values in 50-100K in 2013, 2017, 2020, and most recently November 2025 all around 73 x10^9/L. He has no history of major bleeding, epistaxis, or petechiae. He notes easy bruising after minor trauma without uncontrolled bleeding.  He has significant fatigue and often falls asleep when sitting quietly, including in the car. 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 He has no dyspnea with routine activity but becomes winded with large flights of stairs. He denies chest pain or other associated symptoms.  His father also has lifelong thrombocytopenia without known liver disease. He smokes three packs per day. He previously drank alcohol daily but has been abstinent for about 12 years. He had elevated liver enzymes in 2013 during heavy alcohol use, which normalized on labs in 2017 and 2020. He denies known liver or kidney disease and has no history of intravenous drug use.     MEDICAL HISTORY:  Past Medical History:  Diagnosis Date   Anxiety    Chronic pain syndrome    Closed fracture of acetabulum (HCC)    Closed fracture of metatarsal bone(s)    Closed fracture of upper end of tibia    Depression    Traumatic arthropathy, lower leg     SURGICAL HISTORY: History reviewed. No pertinent  surgical history.  SOCIAL HISTORY: Social History   Socioeconomic History   Marital status: Divorced    Spouse name: Not on file   Number of children: Not on file   Years of education: Not on file   Highest education level: Not on file  Occupational History   Not on file  Tobacco Use   Smoking status: Every Day    Current packs/day: 3.00    Average packs/day: 3.0 packs/day for 36.1 years (108.2 ttl pk-yrs)    Types: Cigarettes    Start date: 10   Smokeless tobacco: Never  Substance and Sexual Activity   Alcohol use: Not Currently    Alcohol/week: 40.0 standard drinks of alcohol    Types: 40 Cans of beer per week    Comment: he used to drink beer for 10 years, quit in 2010   Drug use: No   Sexual activity: Not on file    Comment: DIVORCED  Other Topics Concern   Not on file  Social History Narrative   Not on file   Social Drivers of Health   Tobacco Use: High Risk (01/04/2025)   Patient History    Smoking Tobacco Use: Every Day    Smokeless Tobacco Use: Never    Passive Exposure: Not on file  Financial Resource Strain: Not on file  Food Insecurity: Not on file  Transportation Needs: Not on file  Physical Activity: Not on file  Stress: Not on file  Social Connections: Not on file  Intimate Partner Violence: Not on file  Depression (EYV7-0): Low Risk (  01/04/2025)   Depression (PHQ2-9)    PHQ-2 Score: 0  Alcohol Screen: Not on file  Housing: Not on file  Utilities: Not on file  Health Literacy: Not on file    FAMILY HISTORY: History reviewed. No pertinent family history.  ALLERGIES:  is allergic to citalopram hydrobromide.  MEDICATIONS:  Current Outpatient Medications  Medication Sig Dispense Refill   ACCU-CHEK GUIDE TEST test strip SMARTSIG:1-2 Times Daily     Blood Glucose Monitoring Suppl (ACCU-CHEK GUIDE) w/Device KIT as directed.     budesonide-formoterol (SYMBICORT) 160-4.5 MCG/ACT inhaler Inhale into the lungs.     albuterol (PROVENTIL  HFA;VENTOLIN HFA) 108 (90 BASE) MCG/ACT inhaler Inhale into the lungs daily as needed.     atorvastatin (LIPITOR) 20 MG tablet Take 20 mg by mouth daily.     fluticasone (FLONASE) 50 MCG/ACT nasal spray Place 2 sprays into the nose daily as needed. NASAL CONGESTION     Fluticasone-Salmeterol (ADVAIR) 250-50 MCG/DOSE AEPB Inhale 1-2 puffs into the lungs daily.      gabapentin (NEURONTIN) 300 MG capsule Take 600 mg by mouth at bedtime.     meloxicam (MOBIC) 15 MG tablet Take 15 mg by mouth daily.     metFORMIN (GLUCOPHAGE) 500 MG tablet Take 500 mg by mouth 2 (two) times daily.     metoprolol tartrate (LOPRESSOR) 25 MG tablet Take 25 mg by mouth 2 (two) times daily.     MOUNJARO 5 MG/0.5ML Pen INJECT 5MG  SUBCUTANEOUSLY EVERY WEEK     olmesartan (BENICAR) 20 MG tablet Take 20 mg by mouth daily.     omeprazole (PRILOSEC) 40 MG capsule Take 40 mg by mouth daily.     pramipexole (MIRAPEX) 1.5 MG tablet Take by mouth.     rOPINIRole (REQUIP) 0.5 MG tablet Take 0.5-1 mg by mouth at bedtime.     tadalafil (CIALIS) 20 MG tablet SMARTSIG:1 Tablet(s) By Mouth As Needed     tamsulosin (FLOMAX) 0.4 MG CAPS capsule Take by mouth.     Testosterone  20.25 MG/ACT (1.62%) GEL SMARTSIG:20.25 Milligram(s) Topical Every Morning     valACYclovir (VALTREX) 500 MG tablet Take 500 mg by mouth 2 (two) times daily as needed.     XYOSTED 75 MG/0.5ML SOAJ INJECT 75 MG SUBCUTANEOUSLY EVERY 2 WEEKS     No current facility-administered medications for this visit.    REVIEW OF SYSTEMS:   Constitutional: Denies fevers, chills or abnormal night sweats Eyes: Denies blurriness of vision, double vision or watery eyes Ears, nose, mouth, throat, and face: Denies mucositis or sore throat Respiratory: Denies cough, dyspnea or wheezes Cardiovascular: Denies palpitation, chest discomfort or lower extremity swelling Gastrointestinal:  Denies nausea, heartburn or change in bowel habits Skin: Denies abnormal skin rashes Lymphatics:  Denies new lymphadenopathy or easy bruising Neurological:Denies numbness, tingling or new weaknesses Behavioral/Psych: Mood is stable, no new changes  All other systems were reviewed with the patient and are negative.  PHYSICAL EXAMINATION: ECOG PERFORMANCE STATUS: 1 - Symptomatic but completely ambulatory  Vitals:   01/04/25 1335  BP: 100/60  Pulse: 73  Resp: 18  Temp: 98.9 F (37.2 C)  SpO2: 96%   Filed Weights   01/04/25 1335  Weight: 213 lb (96.6 kg)    GENERAL:alert, no distress and comfortable SKIN: skin color, texture, turgor are normal, no rashes or significant lesions EYES: normal, conjunctiva are pink and non-injected, sclera clear OROPHARYNX:no exudate, no erythema and lips, buccal mucosa, and tongue normal  NECK: supple, thyroid  normal size, non-tender, without nodularity  LYMPH:  no palpable lymphadenopathy in the cervical, axillary or inguinal LUNGS: clear to auscultation and percussion with normal breathing effort HEART: regular rate & rhythm and no murmurs and no lower extremity edema ABDOMEN:abdomen soft, non-tender and normal bowel sounds Musculoskeletal:no cyanosis of digits and no clubbing  PSYCH: alert & oriented x 3 with fluent speech NEURO: no focal motor/sensory deficits  Physical Exam   LABORATORY DATA:  I have reviewed the data as listed    Latest Ref Rng & Units 10/26/2019   10:16 AM 10/25/2016    4:30 PM 03/09/2012    5:31 PM  CBC  WBC 4.0 - 10.5 K/uL 7.0  10.0  5.2   Hemoglobin 13.0 - 17.0 g/dL 82.8  83.6  83.3   Hematocrit 39.0 - 52.0 % 50.9  46.3  45.3   Platelets 150 - 400 K/uL 96  94  60       Latest Ref Rng & Units 10/26/2019   10:16 AM 10/25/2016    4:30 PM 03/09/2012    7:43 PM  CMP  Glucose 70 - 99 mg/dL 860  83  866   BUN 6 - 20 mg/dL 17  17  6    Creatinine 0.61 - 1.24 mg/dL 8.87  8.88  9.34   Sodium 135 - 145 mmol/L 139  137  126   Potassium 3.5 - 5.1 mmol/L 3.8  3.7  3.9   Chloride 98 - 111 mmol/L 103  104  83   CO2  22 - 32 mmol/L 26  24  29    Calcium 8.9 - 10.3 mg/dL 9.7  9.3  9.3   Total Protein 6.5 - 8.1 g/dL 7.2  7.4    Total Bilirubin 0.3 - 1.2 mg/dL 1.1  0.7    Alkaline Phos 38 - 126 U/L 82  89    AST 15 - 41 U/L 20  24    ALT 0 - 44 U/L 20  28       RADIOGRAPHIC STUDIES: I have personally reviewed the radiological images as listed and agreed with the findings in the report. No results found.   Assessment & Plan Thrombocytopenia Chronic mild thrombocytopenia for over 20 years with platelet counts consistently 50-100 x10^9/L. No major bleeding, epistaxis, or significant bruising, but reports easy bruising after minor trauma. Fatigue is significant but not clearly attributable to thrombocytopenia. Etiology remains unclear. No evidence of acute decompensation or severe cytopenias. Family history of similar findings in his father without known liver disease. No prior therapies required.  He did drink alcohol heavily in the past, which can contribute to his thrombocytopenia.  Other etiology is chronic ITP.  - Ordered hepatitis panel and HIV testing to rule out viral etiologies. - Ordered B12 and folate levels to assess for nutritional deficiencies. - Ordered fasting abdominal ultrasound to evaluate for liver disease or splenomegaly. - Recommended continued abstinence from alcohol. - Advised to contact primary care physician to schedule lung cancer screening CT. - Planned follow-up by phone in approximately two weeks to review results and discuss further management.  Plan - Labs today - Abdominal ultrasound to evaluate liver and spleen in the next few weeks - I will call him with the above test results in a few weeks.   Orders Placed This Encounter  Procedures   US  Abdomen Limited    Standing Status:   Future    Expected Date:   01/11/2025    Expiration Date:   01/04/2026    Reason for Exam (SYMPTOM  OR DIAGNOSIS REQUIRED):   thrombocytopenia    Preferred imaging location?:   [facility]   CBC with Differential/Platelet    Standing Status:   Future    Number of Occurrences:   1    Expected Date:   01/04/2025    Expiration Date:   01/04/2026   Ferritin    Standing Status:   Future    Number of Occurrences:   1    Expiration Date:   01/04/2026   Immature Platelet Fraction    Standing Status:   Future    Number of Occurrences:   1    Expected Date:   01/04/2025    Expiration Date:   01/04/2026   Hepatitis panel, acute    Standing Status:   Future    Number of Occurrences:   1    Expected Date:   01/04/2025    Expiration Date:   01/04/2026   HIV antibody (with reflex)    Standing Status:   Future    Number of Occurrences:   1    Expected Date:   01/04/2025    Expiration Date:   01/04/2026   Vitamin B12    Standing Status:   Future    Number of Occurrences:   1    Expected Date:   01/04/2025    Expiration Date:   01/04/2026   Folate, Serum    Standing Status:   Future    Number of Occurrences:   1    Expected Date:   01/04/2025    Expiration Date:   01/04/2026    All questions were answered. The patient knows to call the clinic with any problems, questions or concerns. I spent 35 minutes counseling the patient face to face. The total time spent in the appointment was 45 minutes including review of chart and various tests results, discussions about plan of care and coordination of care plan.     Onita Mattock, MD 01/04/2025 2:55 PM    "

## 2025-01-15 ENCOUNTER — Ambulatory Visit (HOSPITAL_COMMUNITY): Admission: RE | Admit: 2025-01-15 | Source: Ambulatory Visit

## 2025-01-22 ENCOUNTER — Inpatient Hospital Stay: Admitting: Hematology
